# Patient Record
Sex: Male | Born: 1950 | Race: White | Hispanic: No | Marital: Married | Smoking: Never smoker
Health system: Southern US, Community
[De-identification: ages and names within clinical notes are randomized; demographics above are authoritative.]

## PROBLEM LIST (undated history)

## (undated) DIAGNOSIS — J42 Unspecified chronic bronchitis: Secondary | ICD-10-CM

## (undated) DIAGNOSIS — M2550 Pain in unspecified joint: Secondary | ICD-10-CM

## (undated) DIAGNOSIS — G43909 Migraine, unspecified, not intractable, without status migrainosus: Secondary | ICD-10-CM

## (undated) DIAGNOSIS — J189 Pneumonia, unspecified organism: Secondary | ICD-10-CM

## (undated) DIAGNOSIS — F419 Anxiety disorder, unspecified: Secondary | ICD-10-CM

## (undated) DIAGNOSIS — I10 Essential (primary) hypertension: Secondary | ICD-10-CM

## (undated) DIAGNOSIS — M1990 Unspecified osteoarthritis, unspecified site: Secondary | ICD-10-CM

## (undated) HISTORY — PX: BACK SURGERY: SHX140

## (undated) HISTORY — PX: TONSILLECTOMY: SUR1361

## (undated) HISTORY — PX: VASECTOMY: SHX75

## (undated) HISTORY — PX: JOINT REPLACEMENT: SHX530

## (undated) HISTORY — PX: COLONOSCOPY: SHX174

## (undated) HISTORY — PX: WISDOM TOOTH EXTRACTION: SHX21

## (undated) HISTORY — PX: SHOULDER OPEN ROTATOR CUFF REPAIR: SHX2407

---

## 1999-08-17 ENCOUNTER — Encounter: Payer: Self-pay | Admitting: Otolaryngology

## 1999-08-17 ENCOUNTER — Encounter: Admission: RE | Admit: 1999-08-17 | Discharge: 1999-08-17 | Payer: Self-pay | Admitting: Otolaryngology

## 1999-10-15 ENCOUNTER — Encounter: Admission: RE | Admit: 1999-10-15 | Discharge: 1999-11-05 | Payer: Self-pay | Admitting: Neurosurgery

## 2003-01-18 HISTORY — PX: LAMINECTOMY AND MICRODISCECTOMY LUMBAR SPINE: SHX1913

## 2003-07-16 ENCOUNTER — Ambulatory Visit (HOSPITAL_COMMUNITY): Admission: RE | Admit: 2003-07-16 | Discharge: 2003-07-17 | Payer: Self-pay | Admitting: Neurosurgery

## 2004-04-09 ENCOUNTER — Ambulatory Visit (HOSPITAL_COMMUNITY): Admission: RE | Admit: 2004-04-09 | Discharge: 2004-04-09 | Payer: Self-pay | Admitting: Gastroenterology

## 2006-01-17 HISTORY — PX: TOTAL HIP ARTHROPLASTY: SHX124

## 2006-03-31 ENCOUNTER — Inpatient Hospital Stay (HOSPITAL_COMMUNITY): Admission: RE | Admit: 2006-03-31 | Discharge: 2006-04-02 | Payer: Self-pay | Admitting: Orthopedic Surgery

## 2007-07-25 ENCOUNTER — Encounter: Admission: RE | Admit: 2007-07-25 | Discharge: 2007-07-25 | Payer: Self-pay | Admitting: Family Medicine

## 2009-01-17 HISTORY — PX: ANKLE ARTHROSCOPY: SHX545

## 2010-06-04 NOTE — Op Note (Signed)
NAME:  John Simpson, John Simpson NO.:  [id number]   MEDICAL RECORD NO.:  [id number]                   PATIENT TYPE:  OIB   LOCATION:  2899                                 FACILITY:  [facility]   PHYSICIAN:  Cristi Loron, M.D.            DATE OF BIRTH:  [birth year]   DATE OF PROCEDURE:  07/16/2003  DATE OF DISCHARGE:                                 OPERATIVE REPORT   PREOPERATIVE DIAGNOSES:  Left L5-S1 herniated nucleus pulposus, stenosis,  lumbar radiculopathy, lumbago.   POSTOPERATIVE DIAGNOSIS:  Left L5-S1 herniated nucleus pulposus, stenosis,  lumbar radiculopathy, lumbago.   PROCEDURE:  Left l5S1 microdiskectomy using microdissection.   SURGEON:  Cristi Loron, M.D.   ANESTHESIA:  General endotracheal.   ESTIMATED BLOOD LOSS:  50 mL.   SPECIMENS:  None.   DRAINS:  None.   COMPLICATIONS:  None.   BRIEF HISTORY:  The patient is a 60 year old white male who has suffered  from back and left leg pain consistent with a left S1 radiculopathy.  He  failed medical management and was worked up with a lumbar MRI, which  demonstrated a large herniated disk at L5-S1 on the left.  I discussed the  various treatment options with him, including surgery.  The patient has  weighed the risks, benefits, and alternatives of surgery and decided to  proceed with a left L5-S1 microdiskectomy.   DESCRIPTION OF PROCEDURE:  The patient was brought to the operating room by  the anesthesia team.  General endotracheal anesthesia was induced.  The  patient was turned to the prone position on a Wilson frame.  His lumbosacral  region was then shaved and prepared with Betadine scrub and Betadine  solution.  Sterile drapes were applied.  I then injected the area to be  incised with Marcaine with epinephrine solution and used a scalpel to make a  linear midline incision over the l5S1 interspace.  I used electrocautery to  perform a left-sided subperiosteal dissection exposing  the left spinous  process and lamina of L5-S1.  I inserted the McCullough retractor for  exposure and then obtained the intraoperative radiograph to confirm our  location.   We then brought the operating microscope into the field and under its  magnification and illumination completed the microdissection/decompression.  We used the high-speed drill to perform a left L5 laminotomy.  We widened  the laminotomy with the Kerrison punch, removing the left L5-S1 ligamentum  flavum.  We removed the cephalad aspect of the left S1 lamina and performed  a generous foraminotomy about the left S1 nerve root.  We then used  microdissection to free up the nerve root from the epidural tissue and then  the thecal sac was gently retracted medially with the D'Errico retractor,  which exposed a herniated disk which had migrated from the L5-S1 interspace  caudally and it was compressing the left S1 nerve root as it began to exit  the  neural foramen.  We used microdissection to free up the disk herniation  and removed it in multiple fragments using a pituitary forceps.  We then  inspected the intervertebral disk.  There was a hole in the annulus but no  impending herniations and no ventral compression of the thecal sac on the S1  nerve root, so we did not enter into the intervertebral disk space.  We then  obtained stringent hemostasis using bipolar electrocautery, copiously  irrigated the wound out with bacitracin solution, and then palpated along  the ventral surface of the thecal sac along the exit route of the S1 nerve  root, and noted the neural structures were well-decompressed.  We then  removed the McCullough retractor and then reapproximated the patient's  thoracolumbar fascia with interrupted #1 Vicryl suture, the subcutaneous  tissue with interrupted 2-0 Vicryl suture, and the skin with Steri-Strips  and Benzoin.  The wound was then coated with bacitracin ointment, a sterile  dressing was applied,  the drapes were removed, and the patient was  subsequently returned to the supine position, where he was extubated by the  anesthesia team and transported to the postanesthesia care unit in stable  condition.  All sponge, instrument, and needle counts were correct at the  end of this case.                                               Cristi Loron, M.D.    JDJ/MEDQ  D:  07/16/2003  T:  07/17/2003  Job:  [id number]

## 2010-06-04 NOTE — Op Note (Signed)
NAME:  John Simpson, John Simpson NO.:  [id number]   MEDICAL RECORD NO.:  [id number]          PATIENT TYPE:  INP   LOCATION:  5023                         FACILITY:  [facility]   PHYSICIAN:  Harvie Junior, M.D.   DATE OF BIRTH:  Nov 15, 1950   DATE OF PROCEDURE:  DATE OF DISCHARGE:                               OPERATIVE REPORT   PREOPERATIVE DIAGNOSIS:  End-stage degenerative joint disease right hip.   POSTOPERATIVE DIAGNOSIS:  End-stage degenerative joint disease right  hip.   PRINCIPAL PROCEDURE:  Right total hip replacement with a Laural Benes and  Johnson S-ROM prosthesis.  We used an 18/13 stem.  We used an 75 F large  cone.  We used a 36 plus 8 off set stem, a 52 ASR cup with a  corresponding 46 mm metallic ball, plus 0.   SURGEON:  Harvie Junior, M.D.   ASSISTANT:  Marshia Ly, P.A.   ANESTHESIA:  General.   BRIEF HISTORY:  Ms. Sales is a 60 year old male with a long history  of having significant right hip pain.  He had been treated conservative  for a long period time.  We have done intra-articular injections, we  have done anti-inflammatory medication.  All of this was attempted and  failed and because of complaints of pain and inability to enjoy life and  work, he was ultimately brought to the operating room for right total  hip replacement.   PROCEDURE:  The patient was brought to the operating room after adequate  anesthesia was obtained with general anesthetic, the patient was placed  supine on the operating room table.  The right hip was then prepped and  draped in the usual sterile fashion.  Following this, the patient was  moved into the left lateral decubitus position all bony prominence were  well padded.  Attention was then turned to the right hip which was  prepped and draped in the usual sterile fashion.  Following this, a  curved incision was made for a posterior approach to the hip,  subcutaneous tissues were taken down to the level of the  tensor fascia  which was divided in line with its fibers.  At this point, the  piriformis tendon was taken down and tagged, the short external rotator  was taken down and tagged.  The posterior capsule was taken down with a  flap posteriorly and inferiorly and then the hip was dislocated.  Provisional neck cut was made.  The acetabulum was then identified.  The  labrum was debrided.  The acetabulum was sequentially reamed to a level  of 51 and a 52ASR cup was hammered into place, excellent fit and  stability was achieved at this point and there was a little bit of  calcified labrum both anteriorly and posteriorly which was debrided at  this point.  Following this attention was turned to the femoral side  where the femur was sequentially reamed to a level of 13.  A 13.5 reamer  was taken down about 3/4 of the way and this had significant bone.  At  this point, attention was turned to the cone side  and this was  sequentially reamed to an 64 F large for a 36 cup which was previously  chosen.  After the hip was dislocated, the provisional neck cut was made  at the level of intended resection.   At this point, attention was turned towards the trial implants.  An 18-F  large trial cone was used after the mechanical milling-device was used  to make for a large spout.  The 18-F large cone was then placed.  The  trial 36 plus 8 off set with a +0 ball 46.  Range of motion showed  significant tightness with extension.  At that point, a fairly  significant anterior capsular release was performed.  Once that was  completed, the extension got much better.  We dialed in about 20 degrees  of anteversion and this seemed to help stability as well.  Once this was  completed, the trial implants were removed.  The final components were  put into place, 58 F large cone.  A 36 plus 8 off set stem 18/13 with 20  degrees of anteversion relative to the cone which gave about 40 degrees  of anteversion relative to  the neutral leg.   A 46 mm trial ball was then used and gave excellent off set stability  and at that point, the final ball was put in place, hammered in and then  the hip was reduced.  Excellent stability, excellent range of motion.  The tightness in external rotation improved dramatically with the  anterior capsular release and at this point, the short external rotators  and posterior capsule were repaired to the intertrochanteric line with  #2 Ethibond interrupted sutures.  The tensor fascia was closed with #1  Vicryl running suture, skin with #0 and 2-0 Vicryl and skin staples.  Sterile compressive dressing was applied as well as knee immobilizer.  The patient was taken to recovery and was noted to be in satisfactory  condition.   ESTIMATED BLOOD LOSS:  300 mL.      Harvie Junior, M.D.  Electronically Signed     JLG/MEDQ  D:  03/31/2006  T:  04/01/2006  Job:  [id number]

## 2010-06-04 NOTE — Op Note (Signed)
NAME:  John Simpson, John Simpson NO.:  [id number]   MEDICAL RECORD NO.:  [id number]          PATIENT TYPE:  AMB   LOCATION:  ENDO                         FACILITY:  [facility]   PHYSICIAN:  Petra Kuba, M.D.    DATE OF BIRTH:  [birth year]   DATE OF PROCEDURE:  04/09/2004  DATE OF DISCHARGE:                                 OPERATIVE REPORT   PROCEDURE PERFORMED:  Colonoscopy.   ENDOSCOPIST:  Petra Kuba, M.D.   INDICATIONS:  Screening.   CONSENT:  The consent was signed after risks, benefits, methods and options  were thoroughly with both myself and our colonoscopy nurse, Jan, in our  office.   MEDICATIONS:  Medicines used:  Demerol 75 and Versed 7.5.   DESCRIPTION OF PROCEDURE:  Rectal inspection was pertinent for external  hemorrhoids, small.  Digital exam was negative.   The video colonoscope was inserted and easily passed around the colon to the  cecum.  This did not require any abdominal pressure or any position changes.  No abnormality was seen on insertion.  The cecum was identified by the  appendiceal orifice and the ileocecal valve.  In fact, the scope was  inserted shortways in the terminal ileum, which was normal.  Further  documentation was obtained.  The scope was slowly withdrawn.   GI was normal.  On slow withdrawal back to the rectum no abnormalities were  seen, specifically no polyp, tumors, masses or diverticula. Once back in the  rectum anorectal pull-through and retroflexion confirmed some small  hemorrhoids.  The scope was straightened and readvanced shortways up the  left side of the colon.   Air was suctioned.  The scope was removed.   The patient tolerated the procedure well.   COMPLICATIONS:  There were no obvious immediate complications.   ENDOSCOPIC DIAGNOSES:  1.  Internal and external small hemorrhoids.  2.  Otherwise within normal limits to the terminal ileum.      MEM/MEDQ  D:  04/09/2004  T:  04/11/2004  Job:  [id number]   cc:    Jethro Bastos, M.D.  [address]  [place]  [place] [address]  Fax: [phone]

## 2012-01-30 ENCOUNTER — Encounter (HOSPITAL_BASED_OUTPATIENT_CLINIC_OR_DEPARTMENT_OTHER): Payer: Self-pay | Admitting: *Deleted

## 2012-01-30 ENCOUNTER — Encounter (HOSPITAL_BASED_OUTPATIENT_CLINIC_OR_DEPARTMENT_OTHER)
Admission: RE | Admit: 2012-01-30 | Discharge: 2012-01-30 | Disposition: A | Discharge status: Home / Self Care | Payer: Federal, State, Local not specified - PPO | Source: Ambulatory Visit | Attending: Orthopedic Surgery | Admitting: Orthopedic Surgery

## 2012-01-30 LAB — BASIC METABOLIC PANEL
Chloride: 100 mEq/L (ref 96–112)
Creatinine, Ser: 0.93 mg/dL (ref 0.50–1.35)
GFR calc Af Amer: >90 mL/min (ref >90)
Potassium: 3.7 mEq/L (ref 3.5–5.1)

## 2012-01-30 NOTE — Progress Notes (Signed)
To come in for [id number] To bring all meds and crutches and overnight bag

## 2012-02-01 ENCOUNTER — Encounter (HOSPITAL_BASED_OUTPATIENT_CLINIC_OR_DEPARTMENT_OTHER): Payer: Self-pay | Admitting: Anesthesiology

## 2012-02-01 ENCOUNTER — Ambulatory Visit (HOSPITAL_BASED_OUTPATIENT_CLINIC_OR_DEPARTMENT_OTHER): Payer: Federal, State, Local not specified - PPO | Admitting: Anesthesiology

## 2012-02-01 ENCOUNTER — Encounter (HOSPITAL_BASED_OUTPATIENT_CLINIC_OR_DEPARTMENT_OTHER): Payer: Self-pay | Admitting: *Deleted

## 2012-02-01 ENCOUNTER — Encounter (HOSPITAL_BASED_OUTPATIENT_CLINIC_OR_DEPARTMENT_OTHER)
Admission: RE | Disposition: A | Discharge status: Home / Self Care | Payer: Self-pay | Source: Ambulatory Visit | Attending: Orthopedic Surgery

## 2012-02-01 ENCOUNTER — Ambulatory Visit (HOSPITAL_BASED_OUTPATIENT_CLINIC_OR_DEPARTMENT_OTHER)
Admission: RE | Admit: 2012-02-01 | Discharge: 2012-02-02 | Disposition: A | Discharge status: Home / Self Care | Payer: Federal, State, Local not specified - PPO | Source: Ambulatory Visit | Attending: Orthopedic Surgery | Admitting: Orthopedic Surgery

## 2012-02-01 ENCOUNTER — Ambulatory Visit: Admit: 2012-02-01 | Payer: Self-pay | Admitting: Orthopedic Surgery

## 2012-02-01 DIAGNOSIS — M249 Joint derangement, unspecified: Secondary | ICD-10-CM | POA: Insufficient documentation

## 2012-02-01 DIAGNOSIS — I10 Essential (primary) hypertension: Secondary | ICD-10-CM | POA: Insufficient documentation

## 2012-02-01 DIAGNOSIS — M25579 Pain in unspecified ankle and joints of unspecified foot: Secondary | ICD-10-CM

## 2012-02-01 DIAGNOSIS — M19079 Primary osteoarthritis, unspecified ankle and foot: Secondary | ICD-10-CM | POA: Insufficient documentation

## 2012-02-01 DIAGNOSIS — M24176 Other articular cartilage disorders, unspecified foot: Secondary | ICD-10-CM | POA: Insufficient documentation

## 2012-02-01 DIAGNOSIS — M6240 Contracture of muscle, unspecified site: Secondary | ICD-10-CM | POA: Insufficient documentation

## 2012-02-01 HISTORY — DX: Unspecified osteoarthritis, unspecified site: M19.90

## 2012-02-01 HISTORY — PX: CALCANEAL OSTEOTOMY WITH TARSAL METATARSAL FUSION: SHX5607

## 2012-02-01 HISTORY — PX: ANKLE ARTHROSCOPY: SHX545

## 2012-02-01 HISTORY — PX: GASTROCNEMIUS RECESSION: SHX863

## 2012-02-01 HISTORY — DX: Essential (primary) hypertension: I10

## 2012-02-01 LAB — POCT HEMOGLOBIN-HEMACUE: Hemoglobin: 17.4 g/dL — ABNORMAL HIGH (ref 13.0–17.0)

## 2012-02-01 SURGERY — ARTHROSCOPY, ANKLE
Anesthesia: Regional | Site: Ankle | Laterality: Right | Wound class: Clean

## 2012-02-01 SURGERY — ARTHROSCOPY, ANKLE
Anesthesia: Choice | Site: Ankle | Laterality: Right

## 2012-02-01 MED ORDER — METHOCARBAMOL 500 MG PO TABS
500.0000 mg | ORAL_TABLET | Freq: Three times a day (TID) | ORAL | Status: DC | PRN
  Administered 2012-02-01: 500 mg via ORAL

## 2012-02-01 MED ORDER — OXYCODONE HCL 5 MG PO TABS: 5.0000 mg | ORAL_TABLET | Freq: Once | ORAL | Status: DC | PRN

## 2012-02-01 MED ORDER — LACTATED RINGERS IV SOLN
INTRAVENOUS | Status: DC
  Administered 2012-02-01 (×3): via INTRAVENOUS

## 2012-02-01 MED ORDER — METOCLOPRAMIDE HCL 5 MG PO TABS: 5.0000 mg | ORAL_TABLET | Freq: Three times a day (TID) | ORAL | Status: DC | PRN

## 2012-02-01 MED ORDER — MULTI-VITAMIN/MINERALS PO TABS: 1.0000 | ORAL_TABLET | Freq: Every day | ORAL | Status: DC

## 2012-02-01 MED ORDER — CEFAZOLIN SODIUM 1-5 GM-% IV SOLN
1.0000 g | Freq: Four times a day (QID) | INTRAVENOUS | Status: AC
  Administered 2012-02-01 – 2012-02-02 (×3): 1 g via INTRAVENOUS

## 2012-02-01 MED ORDER — METHOCARBAMOL 500 MG PO TABS
500.0000 mg | ORAL_TABLET | Freq: Four times a day (QID) | ORAL | Status: DC | PRN
  Administered 2012-02-02: 500 mg via ORAL

## 2012-02-01 MED ORDER — BUPIVACAINE-EPINEPHRINE PF 0.5-1:200000 % IJ SOLN
INTRAMUSCULAR | Status: DC | PRN
  Administered 2012-02-01: 30 mL

## 2012-02-01 MED ORDER — METHOCARBAMOL 100 MG/ML IJ SOLN: 500.0000 mg | Freq: Four times a day (QID) | INTRAVENOUS | Status: DC | PRN

## 2012-02-01 MED ORDER — CHLORHEXIDINE GLUCONATE 4 % EX LIQD: 60.0000 mL | Freq: Once | CUTANEOUS | Status: DC

## 2012-02-01 MED ORDER — FENTANYL CITRATE 0.05 MG/ML IJ SOLN
INTRAMUSCULAR | Status: DC | PRN
  Administered 2012-02-01: 50 ug via INTRAVENOUS

## 2012-02-01 MED ORDER — BUPIVACAINE HCL (PF) 0.5 % IJ SOLN
INTRAMUSCULAR | Status: DC | PRN
  Administered 2012-02-01: 15 mL

## 2012-02-01 MED ORDER — ACETAMINOPHEN 10 MG/ML IV SOLN: 1000.0000 mg | Freq: Once | INTRAVENOUS | Status: DC

## 2012-02-01 MED ORDER — CEFAZOLIN SODIUM-DEXTROSE 2-3 GM-% IV SOLR
2.0000 g | INTRAVENOUS | Status: AC
  Administered 2012-02-01: 2 g via INTRAVENOUS

## 2012-02-01 MED ORDER — MORPHINE SULFATE 2 MG/ML IJ SOLN: 2.0000 mg | INTRAMUSCULAR | Status: DC | PRN

## 2012-02-01 MED ORDER — ONDANSETRON HCL 4 MG/2ML IJ SOLN: 4.0000 mg | Freq: Four times a day (QID) | INTRAMUSCULAR | Status: DC | PRN

## 2012-02-01 MED ORDER — OXYCODONE HCL 5 MG/5ML PO SOLN: 5.0000 mg | Freq: Once | ORAL | Status: DC | PRN

## 2012-02-01 MED ORDER — HYDROMORPHONE HCL PF 1 MG/ML IJ SOLN: 0.2500 mg | INTRAMUSCULAR | Status: DC | PRN

## 2012-02-01 MED ORDER — ASPIRIN EC 325 MG PO TBEC: 325.0000 mg | DELAYED_RELEASE_TABLET | Freq: Two times a day (BID) | ORAL | Status: DC

## 2012-02-01 MED ORDER — SODIUM CHLORIDE 0.9 % IV SOLN
INTRAVENOUS | Status: DC
  Administered 2012-02-01: 50 mL/h via INTRAVENOUS

## 2012-02-01 MED ORDER — DIPHENHYDRAMINE HCL 12.5 MG/5ML PO ELIX: 12.5000 mg | ORAL_SOLUTION | ORAL | Status: DC | PRN

## 2012-02-01 MED ORDER — FENTANYL CITRATE 0.05 MG/ML IJ SOLN
50.0000 ug | INTRAMUSCULAR | Status: DC | PRN
  Administered 2012-02-01: 100 ug via INTRAVENOUS

## 2012-02-01 MED ORDER — LIDOCAINE HCL (CARDIAC) 20 MG/ML IV SOLN
INTRAVENOUS | Status: DC | PRN
  Administered 2012-02-01: 60 mg via INTRAVENOUS

## 2012-02-01 MED ORDER — EPHEDRINE SULFATE 50 MG/ML IJ SOLN
INTRAMUSCULAR | Status: DC | PRN
  Administered 2012-02-01 (×2): 10 mg via INTRAVENOUS

## 2012-02-01 MED ORDER — SODIUM CHLORIDE 0.9 % IR SOLN
Status: DC | PRN
  Administered 2012-02-01: 5500 mL

## 2012-02-01 MED ORDER — SODIUM CHLORIDE 0.9 % IV SOLN: INTRAVENOUS | Status: DC

## 2012-02-01 MED ORDER — GLYCOPYRROLATE 0.2 MG/ML IJ SOLN
INTRAMUSCULAR | Status: DC | PRN
  Administered 2012-02-01: 0.2 mg via INTRAVENOUS

## 2012-02-01 MED ORDER — TEMAZEPAM 15 MG PO CAPS
15.0000 mg | ORAL_CAPSULE | Freq: Every evening | ORAL | Status: DC | PRN
  Administered 2012-02-01: 30 mg via ORAL

## 2012-02-01 MED ORDER — ONDANSETRON HCL 4 MG/2ML IJ SOLN
INTRAMUSCULAR | Status: DC | PRN
  Administered 2012-02-01: 4 mg via INTRAVENOUS

## 2012-02-01 MED ORDER — LISINOPRIL-HYDROCHLOROTHIAZIDE 20-12.5 MG PO TABS: 1.0000 | ORAL_TABLET | Freq: Every day | ORAL | Status: DC

## 2012-02-01 MED ORDER — DEXAMETHASONE SODIUM PHOSPHATE 10 MG/ML IJ SOLN
INTRAMUSCULAR | Status: DC | PRN
  Administered 2012-02-01: 10 mg via INTRAVENOUS

## 2012-02-01 MED ORDER — OXYCODONE-ACETAMINOPHEN 5-325 MG PO TABS
1.0000 | ORAL_TABLET | ORAL | Status: DC | PRN
  Administered 2012-02-01 – 2012-02-02 (×3): 2 via ORAL

## 2012-02-01 MED ORDER — OXYCODONE-ACETAMINOPHEN 5-325 MG PO TABS: 1.0000 | ORAL_TABLET | ORAL | Status: DC | PRN

## 2012-02-01 MED ORDER — METOCLOPRAMIDE HCL 5 MG/ML IJ SOLN: 5.0000 mg | Freq: Three times a day (TID) | INTRAMUSCULAR | Status: DC | PRN

## 2012-02-01 MED ORDER — METHOCARBAMOL 500 MG PO TABS: 500.0000 mg | ORAL_TABLET | Freq: Three times a day (TID) | ORAL | Status: DC

## 2012-02-01 MED ORDER — ONDANSETRON HCL 4 MG PO TABS: 4.0000 mg | ORAL_TABLET | Freq: Four times a day (QID) | ORAL | Status: DC | PRN

## 2012-02-01 MED ORDER — VITAMIN C 500 MG PO TABS: 500.0000 mg | ORAL_TABLET | Freq: Every day | ORAL | Status: AC

## 2012-02-01 MED ORDER — MIDAZOLAM HCL 2 MG/2ML IJ SOLN
0.5000 mg | INTRAMUSCULAR | Status: DC | PRN
  Administered 2012-02-01: 2 mg via INTRAVENOUS

## 2012-02-01 MED ORDER — PROPOFOL 10 MG/ML IV BOLUS
INTRAVENOUS | Status: DC | PRN
  Administered 2012-02-01: 200 mg via INTRAVENOUS

## 2012-02-01 SURGICAL SUPPLY — 113 items
BANDAGE ELASTIC 4 VELCRO ST LF (GAUZE/BANDAGES/DRESSINGS) ×4 IMPLANT
BANDAGE ELASTIC 6 VELCRO ST LF (GAUZE/BANDAGES/DRESSINGS) ×4 IMPLANT
BANDAGE GAUZE ELAST BULKY 4 IN (GAUZE/BANDAGES/DRESSINGS) ×4 IMPLANT
BENZOIN TINCTURE PRP APPL 2/3 (GAUZE/BANDAGES/DRESSINGS) ×2 IMPLANT
BLADE AVERAGE 25X9 (BLADE) ×2 IMPLANT
BLADE CCA MICRO SAG (BLADE) ×2 IMPLANT
BLADE CUDA 2.0 (BLADE) IMPLANT
BLADE CUDA SHAVER 3.5 (BLADE) ×2 IMPLANT
BLADE MICRO SAGITTAL (BLADE) ×2 IMPLANT
BLADE OSC/SAG .038X5.5 CUT EDG (BLADE) IMPLANT
BLADE SURG 11 STRL SS (BLADE) IMPLANT
BLADE SURG 15 STRL LF DISP TIS (BLADE) ×4 IMPLANT
BLADE SURG 15 STRL SS (BLADE) ×4
BRUSH SCRUB EZ PLAIN DRY (MISCELLANEOUS) ×2 IMPLANT
BUR 3.5 LG SPHERICAL (BURR) IMPLANT
BUR CUDA 2.9 (BURR) IMPLANT
BUR EGG 3PK/BX (BURR) ×2 IMPLANT
BUR GATOR 2.9 (BURR) IMPLANT
BUR OVAL 4.0 (BURR) IMPLANT
BUR SPHERICAL 2.9 (BURR) IMPLANT
BURR 3.5 LG SPHERICAL (BURR)
CANISTER OMNI JUG 16 LITER (MISCELLANEOUS) ×2 IMPLANT
CANISTER SUCTION 2500CC (MISCELLANEOUS) IMPLANT
CAP PIN PROTECTOR ORTHO WHT (CAP) IMPLANT
CLOTH BEACON ORANGE TIMEOUT ST (SAFETY) ×2 IMPLANT
COTTON STERILE ROLL (GAUZE/BANDAGES/DRESSINGS) ×2 IMPLANT
COVER TABLE BACK 60X90 (DRAPES) ×2 IMPLANT
CUFF TOURNIQUET SINGLE 34IN LL (TOURNIQUET CUFF) ×2 IMPLANT
DRAPE ARTHROSCOPY W/POUCH 114 (DRAPES) ×2 IMPLANT
DRAPE EXTREMITY T 121X128X90 (DRAPE) ×2 IMPLANT
DRAPE INCISE IOBAN 66X45 STRL (DRAPES) IMPLANT
DRAPE OEC MINIVIEW 54X84 (DRAPES) ×2 IMPLANT
DRAPE PED LAPAROTOMY (DRAPES) IMPLANT
DRAPE SURG 17X23 STRL (DRAPES) ×2 IMPLANT
DRSG PAD ABDOMINAL 8X10 ST (GAUZE/BANDAGES/DRESSINGS) ×4 IMPLANT
DURA STEPPER LG (CAST SUPPLIES) IMPLANT
DURA STEPPER MED (CAST SUPPLIES) IMPLANT
DURA STEPPER SML (CAST SUPPLIES) IMPLANT
DURAPREP 26ML APPLICATOR (WOUND CARE) ×2 IMPLANT
ELECT REM PT RETURN 9FT ADLT (ELECTROSURGICAL) ×2
ELECTRODE REM PT RTRN 9FT ADLT (ELECTROSURGICAL) ×1 IMPLANT
FIBERLOOP 2 0 (SUTURE) IMPLANT
GAUZE SPONGE 4X4 16PLY XRAY LF (GAUZE/BANDAGES/DRESSINGS) IMPLANT
GAUZE XEROFORM 1X8 LF (GAUZE/BANDAGES/DRESSINGS) ×2 IMPLANT
GAUZE XEROFORM 5X9 LF (GAUZE/BANDAGES/DRESSINGS) ×2 IMPLANT
GLOVE BIO SURGEON STRL SZ8 (GLOVE) ×2 IMPLANT
GLOVE BIOGEL M STRL SZ7.5 (GLOVE) ×2 IMPLANT
GLOVE BIOGEL PI IND STRL 8 (GLOVE) ×3 IMPLANT
GLOVE BIOGEL PI INDICATOR 8 (GLOVE) ×3
GLOVE ECLIPSE 6.5 STRL STRAW (GLOVE) ×2 IMPLANT
GLOVE SURG SS PI 8.0 STRL IVOR (GLOVE) ×2 IMPLANT
GOWN BRE IMP PREV XXLGXLNG (GOWN DISPOSABLE) ×6 IMPLANT
GOWN PREVENTION PLUS XLARGE (GOWN DISPOSABLE) ×2 IMPLANT
IMPLANT OP-1 (Orthopedic Implant) ×2 IMPLANT
KWIRE 4.0 X .045IN (WIRE) IMPLANT
NDL SUT 6 .5 CRC .975X.05 MAYO (NEEDLE) IMPLANT
NEEDLE HYPO 22GX1.5 SAFETY (NEEDLE) IMPLANT
NEEDLE MAYO TAPER (NEEDLE)
NS IRRIG 1000ML POUR BTL (IV SOLUTION) ×2 IMPLANT
PACK ARTHROSCOPY DSU (CUSTOM PROCEDURE TRAY) ×2 IMPLANT
PACK BASIN DAY SURGERY FS (CUSTOM PROCEDURE TRAY) ×2 IMPLANT
PAD CAST 4YDX4 CTTN HI CHSV (CAST SUPPLIES) ×2 IMPLANT
PADDING CAST ABS 4INX4YD NS (CAST SUPPLIES) ×2
PADDING CAST ABS 6INX4YD NS (CAST SUPPLIES) ×1
PADDING CAST ABS COTTON 4X4 ST (CAST SUPPLIES) ×2 IMPLANT
PADDING CAST ABS COTTON 6X4 NS (CAST SUPPLIES) ×1 IMPLANT
PADDING CAST COTTON 4X4 STRL (CAST SUPPLIES) ×2
PASSER SUT SWANSON 36MM LOOP (INSTRUMENTS) IMPLANT
PENCIL BUTTON HOLSTER BLD 10FT (ELECTRODE) ×2 IMPLANT
SCREW CANN 6.7X55 18 THD SD (Screw) ×4 IMPLANT
SCREW LP TIT 3.5X30 (Screw) ×2 IMPLANT
SHEET MEDIUM DRAPE 40X70 STRL (DRAPES) ×4 IMPLANT
SLEEVE SCD COMPRESS KNEE MED (MISCELLANEOUS) ×2 IMPLANT
SPLINT FAST PLASTER 5X30 (CAST SUPPLIES) ×20
SPLINT PLASTER CAST FAST 5X30 (CAST SUPPLIES) ×20 IMPLANT
SPONGE GAUZE 4X4 12PLY (GAUZE/BANDAGES/DRESSINGS) ×4 IMPLANT
SPONGE LAP 4X18 X RAY DECT (DISPOSABLE) ×2 IMPLANT
STOCKINETTE 6  STRL (DRAPES) ×1
STOCKINETTE 6 STRL (DRAPES) ×1 IMPLANT
STRAP ANKLE FOOT DISTRACTOR (ORTHOPEDIC SUPPLIES) IMPLANT
STRIP CLOSURE SKIN 1/2X4 (GAUZE/BANDAGES/DRESSINGS) ×2 IMPLANT
SUCTION FRAZIER TIP 10 FR DISP (SUCTIONS) ×2 IMPLANT
SUT BONE WAX W31G (SUTURE) ×2 IMPLANT
SUT ETHIBOND 0 MO6 C/R (SUTURE) IMPLANT
SUT ETHIBOND 2 OS 4 DA (SUTURE) IMPLANT
SUT ETHILON 3 0 PS 1 (SUTURE) IMPLANT
SUT ETHILON 4 0 PS 2 18 (SUTURE) ×10 IMPLANT
SUT FIBERWIRE #2 38 T-5 BLUE (SUTURE)
SUT FIBERWIRE 2-0 18 17.9 3/8 (SUTURE) ×2
SUT MNCRL AB 4-0 PS2 18 (SUTURE) ×2 IMPLANT
SUT MON AB 4-0 PC3 18 (SUTURE) ×2 IMPLANT
SUT PDS AB 3-0 PS2 18 (SUTURE) ×2 IMPLANT
SUT VIC AB 0 SH 27 (SUTURE) IMPLANT
SUT VIC AB 2-0 PS2 27 (SUTURE) ×2 IMPLANT
SUT VIC AB 2-0 SH 18 (SUTURE) IMPLANT
SUT VIC AB 2-0 SH 27 (SUTURE)
SUT VIC AB 2-0 SH 27XBRD (SUTURE) IMPLANT
SUT VIC AB 3-0 PS1 18 (SUTURE) ×6
SUT VIC AB 3-0 PS1 18XBRD (SUTURE) ×3 IMPLANT
SUTURE FIBERWR #2 38 T-5 BLUE (SUTURE) IMPLANT
SUTURE FIBERWR 2-0 18 17.9 3/8 (SUTURE) ×1 IMPLANT
SYR 20CC LL (SYRINGE) IMPLANT
SYR 3ML 18GX1 1/2 (SYRINGE) ×2 IMPLANT
SYR BULB 3OZ (MISCELLANEOUS) ×2 IMPLANT
SYR CONTROL 10ML LL (SYRINGE) IMPLANT
TOWEL OR 17X24 6PK STRL BLUE (TOWEL DISPOSABLE) ×6 IMPLANT
TOWEL OR NON WOVEN STRL DISP B (DISPOSABLE) ×2 IMPLANT
TRAY DSU PREP LF (CUSTOM PROCEDURE TRAY) ×2 IMPLANT
TUBE CONNECTING 20X1/4 (TUBING) ×4 IMPLANT
TUBING ARTHROSCOPY IRRIG 16FT (MISCELLANEOUS) ×2 IMPLANT
UNDERPAD 30X30 INCONTINENT (UNDERPADS AND DIAPERS) ×2 IMPLANT
WAND SHORT BEVEL W/CORD (SURGICAL WAND) ×2 IMPLANT
WATER STERILE IRR 1000ML POUR (IV SOLUTION) ×2 IMPLANT

## 2012-02-01 NOTE — H&P (Signed)
  H&P documentation: Placed to be scanned history and physical exam in chart.  -History and Physical Reviewed  -Patient has been re-examined  -No change in the plan of care  Azrael Maddix A  

## 2012-02-01 NOTE — Brief Op Note (Signed)
02/01/2012  1:54 PM  PATIENT:  John Simpson  62 y.o. male  PRE-OPERATIVE DIAGNOSIS:  RIGHT ANTERIOR LATERAL ANKLE IMPINGEMENT WITH VALGUS TALAR TILT, 2ND TMT ARTHRITISTIGHT GASTROC, HINDFOOT MALALIGNMENT  POST-OPERATIVE DIAGNOSIS:  RIGHT ANTERIOR LATERAL ANKLE IMPINGEMENT WITH  PROCEDURE:  Procedure(s) (LRB) with comments: ANKLE ARTHROSCOPY (Right) - WITH EXTENSIVE DEBRIDEMENT GASTROCNEMIUS SLIDE (Right) CALCANEAL OSTEOTOMY WITH TARSAL METATARSAL FUSION (Right) - MEDIALIZING CALCANEAL OSTEOTOMY, 2ND TMT JOINT FUSION WITHOUT OSTEOTOMY, LOCAL BONE GRAFT, STRESS X-RAYS FOOT, POSSIBLE INTERCUNEIFORM FUSION   SURGEON:  Surgeon(s) and Role:    * Sherri Rad, MD - Primary  PHYSICIAN ASSISTANT: Rexene Edison, PAC   ASSISTANTS: Rexene Edison, [facility]    ANESTHESIA:   general  EBL:  Total I/O In: 2000 [I.V.:2000] Out: -   BLOOD ADMINISTERED:none  DRAINS: none   LOCAL MEDICATIONS USED:  NONE  SPECIMEN:  No Specimen  DISPOSITION OF SPECIMEN:  N/A  COUNTS:  YES  TOURNIQUET:  * Missing tourniquet times found for documented tourniquets in log:  16109 *  DICTATION: .Other Dictation: Dictation Number [id number]  PLAN OF CARE: Admit for overnight observation  PATIENT DISPOSITION:  PACU - hemodynamically stable.   Delay start of Pharmacological VTE agent (>24hrs) due to surgical blood loss or risk of bleeding: no

## 2012-02-01 NOTE — Transfer of Care (Signed)
Immediate Anesthesia Transfer of Care Note  Patient: John Simpson  Procedure(s) Performed: Procedure(s) (LRB) with comments: ANKLE ARTHROSCOPY (Right) - WITH EXTENSIVE DEBRIDEMENT GASTROCNEMIUS SLIDE (Right) CALCANEAL OSTEOTOMY WITH TARSAL METATARSAL FUSION (Right) - MEDIALIZING CALCANEAL OSTEOTOMY, 2ND TMT JOINT FUSION WITHOUT OSTEOTOMY, LOCAL BONE GRAFT, STRESS X-RAYS FOOT, POSSIBLE INTERCUNEIFORM FUSION   Patient Location: PACU  Anesthesia Type:GA combined with regional for post-op pain  Level of Consciousness: awake, alert  and oriented  Airway & Oxygen Therapy: Patient Spontanous Breathing and Patient connected to face mask oxygen  Post-op Assessment: Report given to PACU RN, Post -op Vital signs reviewed and stable and Patient moving all extremities  Post vital signs: Reviewed and stable  Complications: No apparent anesthesia complications

## 2012-02-01 NOTE — Anesthesia Preprocedure Evaluation (Addendum)
Anesthesia Evaluation  Patient identified by MRN, date of birth, ID band Patient awake    Reviewed: Allergy & Precautions, H&P , NPO status , Patient's Chart, lab work & pertinent test results  Airway Mallampati: II TM Distance: >3 FB Neck ROM: Full    Dental No notable dental hx. (+) Teeth Intact and Dental Advisory Given   Pulmonary neg pulmonary ROS,  breath sounds clear to auscultation  Pulmonary exam normal       Cardiovascular hypertension, On Medications Rhythm:Regular Rate:Normal     Neuro/Psych negative neurological ROS  negative psych ROS   GI/Hepatic negative GI ROS, Neg liver ROS,   Endo/Other  negative endocrine ROS  Renal/GU negative Renal ROS  negative genitourinary   Musculoskeletal   Abdominal   Peds  Hematology negative hematology ROS (+)   Anesthesia Other Findings   Reproductive/Obstetrics negative OB ROS                           Anesthesia Physical Anesthesia Plan  ASA: II  Anesthesia Plan: General and Regional   Post-op Pain Management:    Induction: Intravenous  Airway Management Planned: LMA  Additional Equipment:   Intra-op Plan:   Post-operative Plan: Extubation in OR  Informed Consent: I have reviewed the patients History and Physical, chart, labs and discussed the procedure including the risks, benefits and alternatives for the proposed anesthesia with the patient or authorized representative who has indicated his/her understanding and acceptance.   Dental advisory given  Plan Discussed with: CRNA  Anesthesia Plan Comments:         Anesthesia Quick Evaluation

## 2012-02-01 NOTE — Progress Notes (Signed)
Assisted Dr. Fitzgerald with right, ultrasound guided, popliteal/saphenous block. Side rails up, monitors on throughout procedure. See vital signs in flow sheet. Tolerated Procedure well. 

## 2012-02-01 NOTE — Anesthesia Procedure Notes (Addendum)
Anesthesia Regional Block:  Popliteal block  Pre-Anesthetic Checklist: ,, timeout performed, Correct Patient, Correct Site, Correct Laterality, Correct Procedure, Correct Position, site marked, Risks and benefits discussed, pre-op evaluation, post-op pain management  Laterality: Right  Prep: Maximum Sterile Barrier Precautions used and chloraprep       Needles:  Injection technique: Single-shot  Needle Type: Echogenic Stimulator Needle          Additional Needles:  Procedures: ultrasound guided (picture in chart) and nerve stimulator Popliteal block  Nerve Stimulator or Paresthesia:  Response: Peroneal, 0.4 mA,  Response: Tibial, 0.4 mA,   Additional Responses:   Narrative:  Start time: 02/01/2012 9:29 AM End time: 02/01/2012 9:40 AM Injection made incrementally with aspirations every 5 mL. Anesthesiologist: Sampson Goon, MD  Additional Notes: 2% Lidocaine skin wheel. Saphenous block above the knee with 10cc of 0.5% Bupivicaine plain.  Popliteal block Procedure Name: LMA Insertion Date/Time: 02/01/2012 10:50 AM Performed by: Suann Larry WOLFE Pre-anesthesia Checklist: Patient identified, Emergency Drugs available, Suction available and Patient being monitored Patient Re-evaluated:Patient Re-evaluated prior to inductionOxygen Delivery Method: Circle system utilized Preoxygenation: Pre-oxygenation with 100% oxygen Intubation Type: IV induction Ventilation: Mask ventilation without difficulty LMA: LMA inserted LMA Size: 5.0 Number of attempts: 1 Airway Equipment and Method: Bite block Placement Confirmation: breath sounds checked- equal and bilateral and positive ETCO2 Tube secured with: Tape

## 2012-02-01 NOTE — Anesthesia Postprocedure Evaluation (Signed)
  Anesthesia Post-op Note  Patient: John Simpson  Procedure(s) Performed: Procedure(s) (LRB) with comments: ANKLE ARTHROSCOPY (Right) - WITH EXTENSIVE DEBRIDEMENT GASTROCNEMIUS SLIDE (Right) CALCANEAL OSTEOTOMY WITH TARSAL METATARSAL FUSION (Right) - MEDIALIZING CALCANEAL OSTEOTOMY, 2ND TMT JOINT FUSION WITHOUT OSTEOTOMY, LOCAL BONE GRAFT, STRESS X-RAYS FOOT, POSSIBLE INTERCUNEIFORM FUSION   Patient Location: PACU  Anesthesia Type:GA combined with regional for post-op pain  Level of Consciousness: awake, alert  and oriented  Airway and Oxygen Therapy: Patient Spontanous Breathing  Post-op Pain: none  Post-op Assessment: Post-op Vital signs reviewed, Patient's Cardiovascular Status Stable, Respiratory Function Stable, Patent Airway and No signs of Nausea or vomiting  Post-op Vital Signs: Reviewed and stable  Complications: No apparent anesthesia complications

## 2012-02-02 NOTE — Op Note (Signed)
NAME:  John Simpson, John Simpson NO.:  [id number]  MEDICAL RECORD NO.:  [id number]  LOCATION:                                 FACILITY:  PHYSICIAN:  Leonides Grills, M.D.     DATE OF BIRTH:  29-May-1950  DATE OF PROCEDURE:  02/01/2012 DATE OF DISCHARGE:                              OPERATIVE REPORT   PREOPERATIVE DIAGNOSES: 1. Right anterior ankle impingement with valgus talar tilt and lateral     compartment arthritis. 2. Right hindfoot malalignment. 3. Right tight gastroc. 4. Right second tarsometatarsal joint arthritis.  POSTOPERATIVE DIAGNOSES: 1. Right anterior ankle impingement with valgus talar tilt and lateral     compartment arthritis. 2. Right hindfoot malalignment. 3. Right tight gastroc. 4. Right second tarsometatarsal joint arthritis.  OPERATIONS: 1. Right ankle arthroscopy. 2. Extensive debridement to his right gastroc slide. 3. Right medializing calcaneal osteotomy. 4. Right second tarsometatarsal joint fusion without osteotomy. 5. Right local bone graft. 6. Stress x-rays, right foot.  ANESTHESIA:  General.  SURGEON:  Leonides Grills, MD  ASSISTANT:  Richardean Canal, PA  ESTIMATED BLOOD LOSS:  Minimal.  COMPLICATIONS:  None.  IMPLANTS: 1. Arthrex 3.5-mm screw x1 Titanium. 2. Arthrex 6.7-mm cannulated screw Titanium.  DISPOSITION:  Stable to PR.  INDICATION:  This is a 62 year old gentleman who has had long-standing right ankle pain and foot pain due to the above pathology that was interfering with his life to the point where he could not to what he wanted to do despite conservative management.  He was consented for the above procedure.  All risks of infection, vessel injury, nonunion, malunion, hardware irritation, hardware failure, persistent pain worse pain, prolonged recovery, stiffness, arthritis, wound healing problems, DVT, PE, and, and the fact that his ankle arthritis could progress were all explained.  Questions were encouraged and  answered.  DESCRIPTION OF PROCEDURE:  The patient was brought to the operating room and placed in supine position after adequate general anesthesia administered as well as Ancef 1 g IV piggyback.  A bump was placed on the right ipsilateral hip, internally rotating right lower extremity. All bony prominences were well padded.  The right lower extremity was then prepped and draped in sterile manner over proximally thigh tourniquet.  We started the procedure by mapping out the anatomical landmarks including the tibialis tendon, peroneus tertius, and superficial peroneal nerve.  We could not be seen clearly, but peroneus tertius could be palpated.  Spinal needle was then placed just medial to the anterior tibialis tendon.  A 20 mL of normal saline was instilled in the ankle.  Weston Brass and spread technique was then utilized, creating anteromedial portal medial to the anterior tibialis tendon.  Blunt tip trocar with cannula followed by camera was then placed in the ankle under direct visualization.  The anterolateral portal was created with spinal needle followed by nick and spread technique.  There was tremendous amount of synovitis over the entire anterior aspect of the ankle.  There was obvious lateral compartment arthritic changes and a very large anterior distal tibial spur that extended into the lateral malleolus as well.  Then, with a curved corners osteotome, burr, and shaver, we meticulously removed the bone that formed  a spur over the anterior aspect of his ankle over the entire anterior aspect of the ankle and cleaned off the synovitis as well.  Hemostasis was obtained via radiofrequency bevel.  We then ranged the ankle.  There was no impingement whatsoever over the entire anterior aspect of the ankle. This was done from lateral to medial.  We also debrided the lateral and medial gutters as well of synovitis and spurs as well.  The medial compartment had decent cartilage as well.   Pictures were obtained throughout the procedure.  Camera was removed.  Wounds were closed with 4-0 nylon stitch.  The limb was then gravity exsanguinated.  At this point, we made a longitudinal incision in the medial aspect of the gastrocnemius musculotendinous junction.  Dissection was carried down through skin.  Hemostasis was obtained.  Fascia was opened in line with the incision.  Conjoined region was then developed to the gastroc soleus.  Soft tissue was elevated off the posterior aspect of gastrocnemius.  Sural nerve was identified and protected posteriorly throughout the case.  Gastrocnemius had released.  Curved Mayo scissors protecting the sural nerve at all times.  This had extra release of tight gastroc.  The area was copiously irrigated with normal saline. Subcu was closed with 3-0 Vicryl, skin was closed with 4-0 Monocryl subcuticular stitch.  Steri-Strips were applied.  Limb was then gravity exsanguinated.  Tourniquet elevated to 290 mmHg.  A longitudinal incision over the lateral aspect of the calcaneal tuber perpendicular to the calcaneal pitch was made.  Dissection was carried down through skin. Hemostasis was obtained.  Careful dissection was carried down to the lateral calcaneus.  Soft tissue was elevated and retracted out of harm's way throughout the case.  Implants were then placed.  Sagittal saw was then utilized to create an osteotomy through the calcaneal tuber.  Once this was done, we then translated the heel approximately a cm medially and then provisionally fixed this with a K-wire.  We then placed through separate incision in the posterior aspect of the heel, 6.7-mm partially threaded cannulated Arthrex Titanium screws x2, 55 mm in length.  This had excellent purchase and maintenance of desired position.  K-wires were removed.  Stress x-ray was obtained in the lateral axial views of the hindfoot and showed gross motion, fixation, proposition, and excellent  alignment as well.  Screws in its proper position as well as the fact that heel was adequately translated medially.  Wound was copiously irrigated with normal saline.  We then made a longitudinal incision over the dorsal aspect of the right second TMT joint just lateral to the neurovascular bundle.  Dissection was carried down through skin.  Hemostasis was obtained.  Dorsalis pedis artery and deep peroneal nerves were identified and retracted out of harm's way throughout the case.  Second TMT joint was then entered.  There was a large spur dorsally and this was removed with a rongeur, and placed on the back table with local bone graft.  We then entered the second TMT joint.  This was verified by C-arm guidance to be in the proper position.  There was no cartilage left in the joint.  Whatever was left scar in the joint were removed with curved corners osteotome, curette, and rongeur.  We then placed 2 multiple 2-mm drill holes on either side of the joint with the aid of a lamina spreader.  We then applied local bone graft as well as OP-1 graft into this area.  We then used a  two- point reduction clamp to compress the joint down.  We then used a burr to create a notch at the base of the second metatarsal and placed a 3.5- mm fully-threaded cortical lag screw using a 3.5 and 2.5 mm drill hole respectively.  This had excellent purchase and maintenance of the desired position and hand as well as excellent compression across the second TMT joint.  We then placed stress strain, relieving local graft in this area as well.  We then obtained stress x-rays in AP, lateral, and oblique planes that showed gross motion, fixation, proposition, and excellent alignment as well.  Prior to graft placement, the area was copiously irrigated with normal saline.  Tourniquet was deflated. Hemostasis was obtained.  There was no pulsatile bleeding.  Subcu over second TMT joint was closed with 4-0 PDS.  Skin was  closed with 4-0 nylon over all wounds.  Sterile dressing was applied.  Modified Jones dressing was applied with the ankle in neutral dorsiflexion.  The patient was stable to PR.     Leonides Grills, M.D.     PB/MEDQ  D:  02/01/2012  T:  02/02/2012  Job:  [id number]

## 2012-02-03 ENCOUNTER — Encounter (HOSPITAL_BASED_OUTPATIENT_CLINIC_OR_DEPARTMENT_OTHER): Payer: Self-pay | Admitting: Orthopedic Surgery

## 2015-02-20 ENCOUNTER — Other Ambulatory Visit: Payer: Self-pay | Admitting: Chiropractic Medicine

## 2015-02-20 DIAGNOSIS — M25512 Pain in left shoulder: Secondary | ICD-10-CM

## 2015-02-26 ENCOUNTER — Ambulatory Visit
Admission: RE | Admit: 2015-02-26 | Discharge: 2015-02-26 | Disposition: A | Discharge status: Home / Self Care | Payer: Federal, State, Local not specified - PPO | Source: Ambulatory Visit | Attending: Chiropractic Medicine | Admitting: Chiropractic Medicine

## 2015-02-26 DIAGNOSIS — M25512 Pain in left shoulder: Secondary | ICD-10-CM

## 2015-05-21 ENCOUNTER — Other Ambulatory Visit: Payer: Self-pay | Admitting: Orthopedic Surgery

## 2015-05-22 ENCOUNTER — Encounter (HOSPITAL_COMMUNITY): Payer: Self-pay | Admitting: *Deleted

## 2015-05-25 ENCOUNTER — Encounter (HOSPITAL_COMMUNITY): Payer: Self-pay | Admitting: Anesthesiology

## 2015-05-25 ENCOUNTER — Inpatient Hospital Stay (HOSPITAL_COMMUNITY): Payer: Federal, State, Local not specified - PPO | Admitting: Anesthesiology

## 2015-05-25 ENCOUNTER — Encounter (HOSPITAL_COMMUNITY)
Admission: RE | Disposition: A | Discharge status: Home Health | Payer: Self-pay | Source: Ambulatory Visit | Attending: Orthopedic Surgery

## 2015-05-25 ENCOUNTER — Inpatient Hospital Stay (HOSPITAL_COMMUNITY)
Admission: RE | Admit: 2015-05-25 | Discharge: 2015-05-28 | DRG: 478 | Disposition: A | Discharge status: Home Health | Payer: Federal, State, Local not specified - PPO | Source: Ambulatory Visit | Attending: Orthopedic Surgery | Admitting: Orthopedic Surgery

## 2015-05-25 DIAGNOSIS — Z7982 Long term (current) use of aspirin: Secondary | ICD-10-CM

## 2015-05-25 DIAGNOSIS — Z96641 Presence of right artificial hip joint: Secondary | ICD-10-CM | POA: Diagnosis present

## 2015-05-25 DIAGNOSIS — Z791 Long term (current) use of non-steroidal anti-inflammatories (NSAID): Secondary | ICD-10-CM | POA: Diagnosis not present

## 2015-05-25 DIAGNOSIS — F419 Anxiety disorder, unspecified: Secondary | ICD-10-CM | POA: Diagnosis present

## 2015-05-25 DIAGNOSIS — T84611A Infection and inflammatory reaction due to internal fixation device of left humerus, initial encounter: Secondary | ICD-10-CM | POA: Diagnosis present

## 2015-05-25 DIAGNOSIS — Y831 Surgical operation with implant of artificial internal device as the cause of abnormal reaction of the patient, or of later complication, without mention of misadventure at the time of the procedure: Secondary | ICD-10-CM | POA: Diagnosis present

## 2015-05-25 DIAGNOSIS — Z79890 Hormone replacement therapy (postmenopausal): Secondary | ICD-10-CM | POA: Diagnosis not present

## 2015-05-25 DIAGNOSIS — I10 Essential (primary) hypertension: Secondary | ICD-10-CM | POA: Diagnosis present

## 2015-05-25 DIAGNOSIS — F17220 Nicotine dependence, chewing tobacco, uncomplicated: Secondary | ICD-10-CM | POA: Diagnosis present

## 2015-05-25 DIAGNOSIS — J42 Unspecified chronic bronchitis: Secondary | ICD-10-CM | POA: Diagnosis present

## 2015-05-25 DIAGNOSIS — M009 Pyogenic arthritis, unspecified: Secondary | ICD-10-CM | POA: Diagnosis present

## 2015-05-25 DIAGNOSIS — M25512 Pain in left shoulder: Secondary | ICD-10-CM | POA: Diagnosis present

## 2015-05-25 DIAGNOSIS — Z9889 Other specified postprocedural states: Secondary | ICD-10-CM

## 2015-05-25 DIAGNOSIS — E669 Obesity, unspecified: Secondary | ICD-10-CM | POA: Diagnosis present

## 2015-05-25 DIAGNOSIS — Z6830 Body mass index (BMI) 30.0-30.9, adult: Secondary | ICD-10-CM | POA: Diagnosis not present

## 2015-05-25 DIAGNOSIS — M00812 Arthritis due to other bacteria, left shoulder: Secondary | ICD-10-CM | POA: Diagnosis not present

## 2015-05-25 DIAGNOSIS — B9689 Other specified bacterial agents as the cause of diseases classified elsewhere: Secondary | ICD-10-CM | POA: Diagnosis not present

## 2015-05-25 HISTORY — DX: Pain in unspecified joint: M25.50

## 2015-05-25 HISTORY — DX: Pneumonia, unspecified organism: J18.9

## 2015-05-25 HISTORY — PX: SHOULDER ARTHROSCOPY: SHX128

## 2015-05-25 HISTORY — PX: INCISION AND DRAINAGE: SHX5863

## 2015-05-25 HISTORY — DX: Anxiety disorder, unspecified: F41.9

## 2015-05-25 HISTORY — DX: Unspecified chronic bronchitis: J42

## 2015-05-25 HISTORY — DX: Migraine, unspecified, not intractable, without status migrainosus: G43.909

## 2015-05-25 LAB — COMPREHENSIVE METABOLIC PANEL
ALT: 45 U/L (ref 17–63)
AST: 42 U/L — AB (ref 15–41)
Albumin: 4.3 g/dL (ref 3.5–5.0)
Alkaline Phosphatase: 53 U/L (ref 38–126)
Anion gap: 10 (ref 5–15)
BUN: 19 mg/dL (ref 6–20)
CHLORIDE: 104 mmol/L (ref 101–111)
CO2: 27 mmol/L (ref 22–32)
CREATININE: 0.83 mg/dL (ref 0.61–1.24)
Calcium: 9.5 mg/dL (ref 8.9–10.3)
GFR calc Af Amer: >60 mL/min (ref >60)
GFR calc non Af Amer: >60 mL/min (ref >60)
Glucose, Bld: 107 mg/dL — ABNORMAL HIGH (ref 65–99)
Potassium: 3.9 mmol/L (ref 3.5–5.1)
SODIUM: 141 mmol/L (ref 135–145)
Total Bilirubin: 0.6 mg/dL (ref 0.3–1.2)
Total Protein: 6.8 g/dL (ref 6.5–8.1)

## 2015-05-25 LAB — PROTIME-INR
INR: 1.14 (ref 0.00–1.49)
Prothrombin Time: 14.8 seconds (ref 11.6–15.2)

## 2015-05-25 LAB — CBC WITH DIFFERENTIAL/PLATELET
BASOS ABS: 0 10*3/uL (ref 0.0–0.1)
Basophils Relative: 0 %
EOS ABS: 0.1 10*3/uL (ref 0.0–0.7)
EOS PCT: 2 %
HCT: 51.2 % (ref 39.0–52.0)
Hemoglobin: 16.2 g/dL (ref 13.0–17.0)
Lymphocytes Relative: 18 %
Lymphs Abs: 1.3 10*3/uL (ref 0.7–4.0)
MCH: 28.1 pg (ref 26.0–34.0)
MCHC: 31.6 g/dL (ref 30.0–36.0)
MCV: 88.7 fL (ref 78.0–100.0)
Monocytes Absolute: 0.5 10*3/uL (ref 0.1–1.0)
Monocytes Relative: 7 %
Neutro Abs: 5.5 10*3/uL (ref 1.7–7.7)
Neutrophils Relative %: 73 %
PLATELETS: 217 10*3/uL (ref 150–400)
RBC: 5.77 MIL/uL (ref 4.22–5.81)
RDW: 14.5 % (ref 11.5–15.5)
WBC: 7.5 10*3/uL (ref 4.0–10.5)

## 2015-05-25 LAB — GRAM STAIN

## 2015-05-25 LAB — SEDIMENTATION RATE: Sed Rate: 2 mm/hr (ref 0–16)

## 2015-05-25 LAB — C-REACTIVE PROTEIN: CRP: <0.5 mg/dL (ref <1.0)

## 2015-05-25 LAB — APTT: APTT: 31 s (ref 24–37)

## 2015-05-25 SURGERY — ARTHROSCOPY, SHOULDER
Anesthesia: General | Site: Shoulder | Laterality: Left

## 2015-05-25 MED ORDER — PROPOFOL 10 MG/ML IV BOLUS
INTRAVENOUS | Status: AC
  Filled 2015-05-25: qty 20

## 2015-05-25 MED ORDER — ACETAMINOPHEN 325 MG PO TABS: 650.0000 mg | ORAL_TABLET | Freq: Four times a day (QID) | ORAL | Status: DC | PRN

## 2015-05-25 MED ORDER — GLYCOPYRROLATE 0.2 MG/ML IJ SOLN
INTRAMUSCULAR | Status: DC | PRN
  Administered 2015-05-25: .4 mg via INTRAVENOUS

## 2015-05-25 MED ORDER — LISINOPRIL-HYDROCHLOROTHIAZIDE 20-12.5 MG PO TABS: 1.0000 | ORAL_TABLET | Freq: Every day | ORAL | Status: DC

## 2015-05-25 MED ORDER — LIDOCAINE 2% (20 MG/ML) 5 ML SYRINGE
INTRAMUSCULAR | Status: AC
  Filled 2015-05-25: qty 10

## 2015-05-25 MED ORDER — CEFAZOLIN SODIUM-DEXTROSE 2-4 GM/100ML-% IV SOLN
2.0000 g | Freq: Four times a day (QID) | INTRAVENOUS | Status: DC
  Administered 2015-05-25 – 2015-05-26 (×3): 2 g via INTRAVENOUS
  Filled 2015-05-25 (×6): qty 100

## 2015-05-25 MED ORDER — DOCUSATE SODIUM 100 MG PO CAPS
100.0000 mg | ORAL_CAPSULE | Freq: Two times a day (BID) | ORAL | Status: DC
  Administered 2015-05-25 – 2015-05-28 (×6): 100 mg via ORAL
  Filled 2015-05-25 (×6): qty 1

## 2015-05-25 MED ORDER — VANCOMYCIN HCL IN DEXTROSE 750-5 MG/150ML-% IV SOLN
750.0000 mg | Freq: Two times a day (BID) | INTRAVENOUS | Status: DC
  Administered 2015-05-25 – 2015-05-27 (×5): 750 mg via INTRAVENOUS
  Filled 2015-05-25 (×7): qty 150

## 2015-05-25 MED ORDER — ONDANSETRON HCL 4 MG/2ML IJ SOLN: 4.0000 mg | Freq: Four times a day (QID) | INTRAMUSCULAR | Status: DC | PRN

## 2015-05-25 MED ORDER — ZOLPIDEM TARTRATE 5 MG PO TABS
5.0000 mg | ORAL_TABLET | Freq: Every evening | ORAL | Status: DC | PRN
  Administered 2015-05-26 (×2): 5 mg via ORAL
  Filled 2015-05-25 (×2): qty 1

## 2015-05-25 MED ORDER — ALPRAZOLAM 0.5 MG PO TABS
0.5000 mg | ORAL_TABLET | Freq: Three times a day (TID) | ORAL | Status: DC | PRN
  Filled 2015-05-25: qty 1

## 2015-05-25 MED ORDER — ALBUTEROL SULFATE (2.5 MG/3ML) 0.083% IN NEBU: 2.5000 mg | INHALATION_SOLUTION | RESPIRATORY_TRACT | Status: DC | PRN

## 2015-05-25 MED ORDER — ONDANSETRON HCL 4 MG/2ML IJ SOLN
INTRAMUSCULAR | Status: DC | PRN
  Administered 2015-05-25: 4 mg via INTRAVENOUS

## 2015-05-25 MED ORDER — FENTANYL CITRATE (PF) 250 MCG/5ML IJ SOLN
INTRAMUSCULAR | Status: AC
  Filled 2015-05-25: qty 5

## 2015-05-25 MED ORDER — METHOCARBAMOL 500 MG PO TABS
500.0000 mg | ORAL_TABLET | Freq: Four times a day (QID) | ORAL | Status: DC | PRN
  Administered 2015-05-25 – 2015-05-28 (×11): 500 mg via ORAL
  Filled 2015-05-25 (×10): qty 1

## 2015-05-25 MED ORDER — ONDANSETRON HCL 4 MG PO TABS: 4.0000 mg | ORAL_TABLET | Freq: Four times a day (QID) | ORAL | Status: DC | PRN

## 2015-05-25 MED ORDER — LISINOPRIL 20 MG PO TABS
20.0000 mg | ORAL_TABLET | Freq: Every day | ORAL | Status: DC
  Administered 2015-05-25 – 2015-05-28 (×4): 20 mg via ORAL
  Filled 2015-05-25 (×4): qty 1

## 2015-05-25 MED ORDER — PHENOL 1.4 % MT LIQD: 1.0000 | OROMUCOSAL | Status: DC | PRN

## 2015-05-25 MED ORDER — DEXTROSE 5 % IV SOLN: 500.0000 mg | Freq: Four times a day (QID) | INTRAVENOUS | Status: DC | PRN

## 2015-05-25 MED ORDER — LACTATED RINGERS IV SOLN
INTRAVENOUS | Status: DC
  Administered 2015-05-25: 14:00:00 via INTRAVENOUS

## 2015-05-25 MED ORDER — ROCURONIUM BROMIDE 100 MG/10ML IV SOLN
INTRAVENOUS | Status: DC | PRN
  Administered 2015-05-25: 20 mg via INTRAVENOUS

## 2015-05-25 MED ORDER — KETOROLAC TROMETHAMINE 15 MG/ML IJ SOLN
15.0000 mg | Freq: Four times a day (QID) | INTRAMUSCULAR | Status: AC
Start: 2015-05-25 — End: 2015-05-26
  Administered 2015-05-25 – 2015-05-26 (×4): 15 mg via INTRAVENOUS
  Filled 2015-05-25 (×3): qty 1

## 2015-05-25 MED ORDER — FENTANYL CITRATE (PF) 100 MCG/2ML IJ SOLN
25.0000 ug | INTRAMUSCULAR | Status: DC | PRN
  Administered 2015-05-25 (×3): 50 ug via INTRAVENOUS

## 2015-05-25 MED ORDER — ALUM & MAG HYDROXIDE-SIMETH 200-200-20 MG/5ML PO SUSP: 30.0000 mL | ORAL | Status: DC | PRN

## 2015-05-25 MED ORDER — HYDROMORPHONE HCL 1 MG/ML IJ SOLN
1.0000 mg | INTRAMUSCULAR | Status: DC | PRN
  Administered 2015-05-25: 1 mg via INTRAVENOUS
  Administered 2015-05-26 – 2015-05-27 (×6): 2 mg via INTRAVENOUS
  Filled 2015-05-25 (×5): qty 2
  Filled 2015-05-25: qty 1
  Filled 2015-05-25: qty 2

## 2015-05-25 MED ORDER — ASPIRIN EC 325 MG PO TBEC
325.0000 mg | DELAYED_RELEASE_TABLET | Freq: Every day | ORAL | Status: DC
  Administered 2015-05-25 – 2015-05-28 (×4): 325 mg via ORAL
  Filled 2015-05-25 (×4): qty 1

## 2015-05-25 MED ORDER — MIDAZOLAM HCL 2 MG/2ML IJ SOLN
INTRAMUSCULAR | Status: AC
  Filled 2015-05-25: qty 2

## 2015-05-25 MED ORDER — NEOSTIGMINE METHYLSULFATE 5 MG/5ML IV SOSY
PREFILLED_SYRINGE | INTRAVENOUS | Status: AC
  Filled 2015-05-25: qty 5

## 2015-05-25 MED ORDER — METHOCARBAMOL 500 MG PO TABS
ORAL_TABLET | ORAL | Status: AC
  Filled 2015-05-25: qty 1

## 2015-05-25 MED ORDER — CHLORHEXIDINE GLUCONATE 4 % EX LIQD: 60.0000 mL | Freq: Once | CUTANEOUS | Status: DC

## 2015-05-25 MED ORDER — EPHEDRINE SULFATE 50 MG/ML IJ SOLN
INTRAMUSCULAR | Status: DC | PRN
  Administered 2015-05-25 (×3): 5 mg via INTRAVENOUS

## 2015-05-25 MED ORDER — FENTANYL CITRATE (PF) 100 MCG/2ML IJ SOLN
INTRAMUSCULAR | Status: AC
  Administered 2015-05-25: 50 ug via INTRAVENOUS
  Filled 2015-05-25: qty 2

## 2015-05-25 MED ORDER — PROPOFOL 10 MG/ML IV BOLUS
INTRAVENOUS | Status: DC | PRN
  Administered 2015-05-25: 30 mg via INTRAVENOUS

## 2015-05-25 MED ORDER — ZOLPIDEM TARTRATE 5 MG PO TABS: 10.0000 mg | ORAL_TABLET | Freq: Every evening | ORAL | Status: DC | PRN

## 2015-05-25 MED ORDER — NEOSTIGMINE METHYLSULFATE 10 MG/10ML IV SOLN
INTRAVENOUS | Status: DC | PRN
  Administered 2015-05-25: 3 mg via INTRAVENOUS

## 2015-05-25 MED ORDER — CEFAZOLIN SODIUM-DEXTROSE 2-4 GM/100ML-% IV SOLN
INTRAVENOUS | Status: AC
  Administered 2015-05-25: 2 g via INTRAVENOUS
  Filled 2015-05-25: qty 100

## 2015-05-25 MED ORDER — METOCLOPRAMIDE HCL 5 MG/ML IJ SOLN: 10.0000 mg | Freq: Once | INTRAMUSCULAR | Status: DC | PRN

## 2015-05-25 MED ORDER — KETOROLAC TROMETHAMINE 15 MG/ML IJ SOLN
INTRAMUSCULAR | Status: AC
  Filled 2015-05-25: qty 1

## 2015-05-25 MED ORDER — MEPERIDINE HCL 25 MG/ML IJ SOLN: 6.2500 mg | INTRAMUSCULAR | Status: DC | PRN

## 2015-05-25 MED ORDER — ONDANSETRON HCL 4 MG/2ML IJ SOLN
INTRAMUSCULAR | Status: AC
  Filled 2015-05-25: qty 2

## 2015-05-25 MED ORDER — 0.9 % SODIUM CHLORIDE (POUR BTL) OPTIME
TOPICAL | Status: DC | PRN
  Administered 2015-05-25: 1000 mL

## 2015-05-25 MED ORDER — OXYCODONE HCL 5 MG PO TABS
ORAL_TABLET | ORAL | Status: AC
  Filled 2015-05-25: qty 3

## 2015-05-25 MED ORDER — FENTANYL CITRATE (PF) 100 MCG/2ML IJ SOLN
INTRAMUSCULAR | Status: DC | PRN
  Administered 2015-05-25 (×2): 50 ug via INTRAVENOUS
  Administered 2015-05-25 (×4): 100 ug via INTRAVENOUS

## 2015-05-25 MED ORDER — ACETAMINOPHEN 650 MG RE SUPP: 650.0000 mg | Freq: Four times a day (QID) | RECTAL | Status: DC | PRN

## 2015-05-25 MED ORDER — OXYCODONE HCL 5 MG PO TABS
15.0000 mg | ORAL_TABLET | Freq: Four times a day (QID) | ORAL | Status: DC | PRN
  Administered 2015-05-25: 15 mg via ORAL

## 2015-05-25 MED ORDER — DEXTROSE-NACL 5-0.45 % IV SOLN: INTRAVENOUS | Status: DC

## 2015-05-25 MED ORDER — GLYCOPYRROLATE 0.2 MG/ML IV SOSY
PREFILLED_SYRINGE | INTRAVENOUS | Status: AC
  Filled 2015-05-25: qty 3

## 2015-05-25 MED ORDER — ARTIFICIAL TEARS OP OINT
TOPICAL_OINTMENT | OPHTHALMIC | Status: DC | PRN
Start: 2015-05-25 — End: 2015-05-25
  Administered 2015-05-25: 1 via OPHTHALMIC

## 2015-05-25 MED ORDER — LIDOCAINE HCL (CARDIAC) 20 MG/ML IV SOLN
INTRAVENOUS | Status: DC | PRN
  Administered 2015-05-25: 80 mg via INTRAVENOUS

## 2015-05-25 MED ORDER — LACTATED RINGERS IV SOLN
INTRAVENOUS | Status: DC | PRN
  Administered 2015-05-25 (×2): via INTRAVENOUS

## 2015-05-25 MED ORDER — OXYCODONE HCL 5 MG PO TABS
15.0000 mg | ORAL_TABLET | ORAL | Status: DC | PRN
  Administered 2015-05-25 – 2015-05-28 (×17): 15 mg via ORAL
  Filled 2015-05-25 (×17): qty 3

## 2015-05-25 MED ORDER — MIDAZOLAM HCL 5 MG/5ML IJ SOLN
INTRAMUSCULAR | Status: DC | PRN
Start: 2015-05-25 — End: 2015-05-25
  Administered 2015-05-25: 2 mg via INTRAVENOUS

## 2015-05-25 MED ORDER — HYDROCHLOROTHIAZIDE 12.5 MG PO CAPS
12.5000 mg | ORAL_CAPSULE | Freq: Every day | ORAL | Status: DC
  Administered 2015-05-25 – 2015-05-28 (×4): 12.5 mg via ORAL
  Filled 2015-05-25 (×4): qty 1

## 2015-05-25 MED ORDER — MENTHOL 3 MG MT LOZG: 1.0000 | LOZENGE | OROMUCOSAL | Status: DC | PRN

## 2015-05-25 MED ORDER — OXYCODONE HCL 15 MG PO TABS: 15.0000 mg | ORAL_TABLET | Freq: Four times a day (QID) | ORAL | Status: AC | PRN

## 2015-05-25 SURGICAL SUPPLY — 65 items
BENZOIN TINCTURE PRP APPL 2/3 (GAUZE/BANDAGES/DRESSINGS) IMPLANT
BLADE CUTTER GATOR 3.5 (BLADE) IMPLANT
BLADE GREAT WHITE 4.2 (BLADE) ×2 IMPLANT
BLADE GREAT WHITE 4.2MM (BLADE) ×1
BLADE SURG 11 STRL SS (BLADE) ×3 IMPLANT
BOOTCOVER CLEANROOM LRG (PROTECTIVE WEAR) ×6 IMPLANT
BUR VERTEX HOODED 4.5 (BURR) IMPLANT
CLOSURE WOUND 1/2 X4 (GAUZE/BANDAGES/DRESSINGS)
CONT SPEC 4OZ CLIKSEAL STRL BL (MISCELLANEOUS) ×3 IMPLANT
COVER SURGICAL LIGHT HANDLE (MISCELLANEOUS) ×3 IMPLANT
DRAIN CHANNEL 10F 3/8 F FF (DRAIN) ×3 IMPLANT
DRAPE IMP U-DRAPE 54X76 (DRAPES) ×3 IMPLANT
DRAPE STERI 35X30 U-POUCH (DRAPES) ×3 IMPLANT
DRAPE SURG 17X23 STRL (DRAPES) ×3 IMPLANT
DRAPE U-SHAPE 47X51 STRL (DRAPES) ×9 IMPLANT
DRSG EMULSION OIL 3X3 NADH (GAUZE/BANDAGES/DRESSINGS) ×3 IMPLANT
DRSG PAD ABDOMINAL 8X10 ST (GAUZE/BANDAGES/DRESSINGS) ×3 IMPLANT
DURAPREP 26ML APPLICATOR (WOUND CARE) ×3 IMPLANT
ELECT MENISCUS 165MM 90D (ELECTRODE) IMPLANT
ELECT REM PT RETURN 9FT ADLT (ELECTROSURGICAL) ×3
ELECTRODE REM PT RTRN 9FT ADLT (ELECTROSURGICAL) ×1 IMPLANT
EVACUATOR SILICONE 100CC (DRAIN) ×3 IMPLANT
FILTER STRAW FLUID ASPIR (MISCELLANEOUS) IMPLANT
GAUZE SPONGE 4X4 12PLY STRL (GAUZE/BANDAGES/DRESSINGS) ×3 IMPLANT
GAUZE XEROFORM 1X8 LF (GAUZE/BANDAGES/DRESSINGS) IMPLANT
GLOVE BIOGEL PI IND STRL 8 (GLOVE) ×2 IMPLANT
GLOVE BIOGEL PI INDICATOR 8 (GLOVE) ×4
GLOVE ECLIPSE 7.5 STRL STRAW (GLOVE) ×6 IMPLANT
GOWN STRL REUS W/ TWL LRG LVL3 (GOWN DISPOSABLE) ×1 IMPLANT
GOWN STRL REUS W/ TWL XL LVL3 (GOWN DISPOSABLE) ×1 IMPLANT
GOWN STRL REUS W/TWL LRG LVL3 (GOWN DISPOSABLE) ×2
GOWN STRL REUS W/TWL XL LVL3 (GOWN DISPOSABLE) ×2
KIT BASIN OR (CUSTOM PROCEDURE TRAY) ×3 IMPLANT
KIT ROOM TURNOVER OR (KITS) ×3 IMPLANT
MANIFOLD NEPTUNE II (INSTRUMENTS) ×3 IMPLANT
NEEDLE 18GX1X1/2 (RX/OR ONLY) (NEEDLE) IMPLANT
NEEDLE 22X1 1/2 (OR ONLY) (NEEDLE) IMPLANT
NEEDLE SPNL 18GX3.5 QUINCKE PK (NEEDLE) ×3 IMPLANT
NS IRRIG 1000ML POUR BTL (IV SOLUTION) ×3 IMPLANT
PACK SHOULDER (CUSTOM PROCEDURE TRAY) ×3 IMPLANT
PACK UNIVERSAL I (CUSTOM PROCEDURE TRAY) IMPLANT
PAD ARMBOARD 7.5X6 YLW CONV (MISCELLANEOUS) ×6 IMPLANT
PENCIL BUTTON HOLSTER BLD 10FT (ELECTRODE) ×3 IMPLANT
SET ARTHROSCOPY TUBING (MISCELLANEOUS) ×2
SET ARTHROSCOPY TUBING LN (MISCELLANEOUS) ×1 IMPLANT
SLING ARM FOAM STRAP LRG (SOFTGOODS) ×3 IMPLANT
SPONGE LAP 4X18 X RAY DECT (DISPOSABLE) IMPLANT
STRIP CLOSURE SKIN 1/2X4 (GAUZE/BANDAGES/DRESSINGS) IMPLANT
SUT ETHILON 2 0 PSLX (SUTURE) ×3 IMPLANT
SUT ETHILON 3 0 PS 1 (SUTURE) ×3 IMPLANT
SUT ETHILON 4 0 PS 2 18 (SUTURE) IMPLANT
SUT VIC AB 1 CT1 27 (SUTURE) ×2
SUT VIC AB 1 CT1 27XBRD ANBCTR (SUTURE) ×1 IMPLANT
SUT VIC AB 2-0 CT1 27 (SUTURE) ×2
SUT VIC AB 2-0 CT1 TAPERPNT 27 (SUTURE) ×1 IMPLANT
SWAB CULTURE LIQUID MINI MALE (MISCELLANEOUS) ×3 IMPLANT
SYR 5ML LL (SYRINGE) IMPLANT
SYR CONTROL 10ML LL (SYRINGE) IMPLANT
TOWEL OR 17X24 6PK STRL BLUE (TOWEL DISPOSABLE) ×3 IMPLANT
TOWEL OR 17X26 10 PK STRL BLUE (TOWEL DISPOSABLE) ×3 IMPLANT
TUBE CONNECTING 12'X1/4 (SUCTIONS) ×1
TUBE CONNECTING 12X1/4 (SUCTIONS) ×2 IMPLANT
WAND HAND CNTRL MULTIVAC 90 (MISCELLANEOUS) IMPLANT
WATER STERILE IRR 1000ML POUR (IV SOLUTION) ×3 IMPLANT
YANKAUER SUCT BULB TIP NO VENT (SUCTIONS) ×3 IMPLANT

## 2015-05-25 NOTE — Anesthesia Postprocedure Evaluation (Signed)
Anesthesia Post Note  Patient: John Simpson  Procedure(s) Performed: Procedure(s) (LRB): SHOULDER ARTHROSCOPY WITH INCISION AND DRAINAGE (Left)  Patient location during evaluation: PACU Anesthesia Type: General Level of consciousness: awake and alert and patient cooperative Pain management: pain level controlled Vital Signs Assessment: post-procedure vital signs reviewed and stable Respiratory status: spontaneous breathing and respiratory function stable Cardiovascular status: stable Anesthetic complications: no    Last Vitals:  Filed Vitals:   05/25/15 1800 05/25/15 1815  BP: 154/97 158/96  Pulse: 57 66  Temp:    Resp: 12 15    Last Pain:  Filed Vitals:   05/25/15 1826  PainSc: 6                  Elbert Spickler S

## 2015-05-25 NOTE — Anesthesia Preprocedure Evaluation (Addendum)
Anesthesia Evaluation  Patient identified by MRN, date of birth, ID band Patient awake    Reviewed: Allergy & Precautions, NPO status , Patient's Chart, lab work & pertinent test results  Airway Mallampati: II  TM Distance: >3 FB Neck ROM: Full    Dental no notable dental hx. (+) Caps   Pulmonary pneumonia, resolved,    Pulmonary exam normal breath sounds clear to auscultation       Cardiovascular hypertension, Pt. on medications Normal cardiovascular exam Rhythm:Regular Rate:Normal     Neuro/Psych  Headaches, Anxiety    GI/Hepatic negative GI ROS, Neg liver ROS,   Endo/Other  Obesity  Renal/GU negative Renal ROS  negative genitourinary   Musculoskeletal  (+) Arthritis , Osteoarthritis,  Post Op Wound Infection   Abdominal (+) + obese,   Peds  Hematology negative hematology ROS (+)   Anesthesia Other Findings   Reproductive/Obstetrics                            Anesthesia Physical Anesthesia Plan  ASA: II  Anesthesia Plan: General   Post-op Pain Management:    Induction: Intravenous  Airway Management Planned: Oral ETT and LMA  Additional Equipment:   Intra-op Plan:   Post-operative Plan: Extubation in OR  Informed Consent: I have reviewed the patients History and Physical, chart, labs and discussed the procedure including the risks, benefits and alternatives for the proposed anesthesia with the patient or authorized representative who has indicated his/her understanding and acceptance.   Dental advisory given  Plan Discussed with: CRNA, Anesthesiologist and Surgeon  Anesthesia Plan Comments:         Anesthesia Quick Evaluation

## 2015-05-25 NOTE — Anesthesia Procedure Notes (Signed)
Procedure Name: Intubation Date/Time: 05/25/2015 3:36 PM Performed by: Izora Gala Pre-anesthesia Checklist: Patient identified, Emergency Drugs available, Suction available and Patient being monitored Patient Re-evaluated:Patient Re-evaluated prior to inductionOxygen Delivery Method: Circle system utilized Preoxygenation: Pre-oxygenation with 100% oxygen Intubation Type: IV induction Ventilation: Mask ventilation without difficulty Laryngoscope Size: Miller and 3 Grade View: Grade II Tube type: Oral Tube size: 7.5 mm Number of attempts: 1 Airway Equipment and Method: Stylet and LTA kit utilized Placement Confirmation: ETT inserted through vocal cords under direct vision and positive ETCO2 Secured at: 21 cm Tube secured with: Tape Dental Injury: Teeth and Oropharynx as per pre-operative assessment

## 2015-05-25 NOTE — H&P (Signed)
PREOPERATIVE H&P  Chief Complaint: left shoulder persistent drainage status post mini open rotator cuff repair  HPI: John Simpson is a 65 y.o. male who presents for evaluation of left shoulder status post previous rotator cuff repair and biceps tenodesis with persistent drainage having failed antibiotic treatment and local I&D in the office. It has been present for 3 weeks and has been worsening. He has failed conservative measures. Pain is rated as mild.  Past Medical History  Diagnosis Date  . Arthritis   . Anxiety     takes Xanax daily as needed  . History of migraine     takes Imitrex daily as needed  . Hypertension     takes Lisinopril-HCTZ daily   . Pneumonia     hx of-as a baby  . History of bronchitis 2016  . Joint pain    Past Surgical History  Procedure Laterality Date  . Total hip arthroplasty  2008    right  . Wisdom tooth extraction    . Tonsillectomy    . Colonoscopy    . Vasectomy    . Ankle arthroscopy  2011    right  . Back surgery  2005    lumb diskectomy  . Ankle arthroscopy  02/01/2012    Procedure: ANKLE ARTHROSCOPY;  Surgeon: Sherri RadPaul A Bednarz, MD;  Location: [facility];  Service: Orthopedics;  Laterality: Right;  WITH EXTENSIVE DEBRIDEMENT  . Gastrocnemius recession  02/01/2012    Procedure: GASTROCNEMIUS SLIDE;  Surgeon: Sherri RadPaul A Bednarz, MD;  Location: [facility];  Service: Orthopedics;  Laterality: Right;  . Calcaneal osteotomy with tarsal metatarsal fusion  02/01/2012    Procedure: CALCANEAL OSTEOTOMY WITH TARSAL METATARSAL FUSION;  Surgeon: Sherri RadPaul A Bednarz, MD;  Location:  SURGERY CENTER;  Service: Orthopedics;  Laterality: Right;  MEDIALIZING CALCANEAL OSTEOTOMY, 2ND TMT JOINT FUSION WITHOUT OSTEOTOMY, LOCAL BONE GRAFT, STRESS X-RAYS FOOT, POSSIBLE INTERCUNEIFORM FUSION    Social History   Social History  . Marital Status: Married    Spouse Name: N/A  . Number of Children: N/A  . Years of Education: N/A    Social History Main Topics  . Smoking status: Never Smoker   . Smokeless tobacco: Current User    Types: Chew  . Alcohol Use: 0.0 oz/week    1-2 Glasses of wine per week     Comment: wine most nights  . Drug Use: No  . Sexual Activity: Yes   Other Topics Concern  . None   Social History Narrative   History reviewed. No pertinent family history. No Known Allergies Prior to Admission medications   Medication Sig Start Date End Date Taking? Authorizing Provider  ALPRAZolam Prudy Feeler(XANAX) 0.5 MG tablet Take 0.5 mg by mouth 3 (three) times daily as needed for anxiety or sleep.  05/06/15  Yes Historical Provider, MD  aspirin 81 MG EC tablet Take 81 mg by mouth daily.    Yes Historical Provider, MD  Cholecalciferol (VITAMIN D) 2000 units tablet Take 2,000 Units by mouth daily.    Yes Historical Provider, MD  diclofenac (VOLTAREN) 75 MG EC tablet Take 75 mg by mouth 2 (two) times daily. 05/14/15  Yes Historical Provider, MD  fish oil-omega-3 fatty acids 1000 MG capsule Take 2 g by mouth daily.   Yes Historical Provider, MD  fluticasone (FLONASE) 50 MCG/ACT nasal spray Place 1 spray into both nostrils daily as needed for allergies.  03/09/15  Yes Historical Provider, MD  HYDROcodone-acetaminophen (NORCO) 10-325 MG tablet Take  1 tablet by mouth every 4 (four) hours as needed for moderate pain.  05/20/15  Yes Historical Provider, MD  lisinopril-hydrochlorothiazide (PRINZIDE,ZESTORETIC) 20-12.5 MG per tablet Take 1 tablet by mouth daily.   Yes Historical Provider, MD  methocarbamol (ROBAXIN) 500 MG tablet Take 500 mg by mouth daily as needed for muscle spasms.  11/20/14  Yes Historical Provider, MD  Multiple Vitamins-Minerals (MULTIVITAMIN WITH MINERALS) tablet Take 1 tablet by mouth daily.   Yes Historical Provider, MD  oxyCODONE (ROXICODONE) 15 MG immediate release tablet Take 15 mg by mouth every 8 (eight) hours as needed for pain.  04/28/15  Yes Historical Provider, MD  SUMAtriptan (IMITREX) 100 MG  tablet 1 tablet. 01/06/15  Yes Historical Provider, MD  testosterone cypionate (DEPOTESTOSTERONE CYPIONATE) 200 MG/ML injection Inject 200 mg into the muscle every 14 (fourteen) days.  05/11/15  Yes Historical Provider, MD  VENTOLIN HFA 108 (90 Base) MCG/ACT inhaler Inhale 2 puffs into the lungs every 4 (four) hours as needed for wheezing or shortness of breath.  03/09/15  Yes Historical Provider, MD  vitamin C (ASCORBIC ACID) 500 MG tablet Take 1 tablet (500 mg total) by mouth daily. 02/01/12  Yes Kirtland Bouchard, PA-C  zolpidem (AMBIEN) 10 MG tablet Take 10 mg by mouth at bedtime as needed for sleep.  04/23/15  Yes Historical Provider, MD     Positive ROS: none  All other systems have been reviewed and were otherwise negative with the exception of those mentioned in the HPI and as above.  Physical Exam: There were no vitals filed for this visit.  General: Alert, no acute distress Cardiovascular: No pedal edema Respiratory: No cyanosis, no use of accessory musculature GI: No organomegaly, abdomen is soft and non-tender Skin: No lesions in the area of chief complaint Neurologic: Sensation intact distally Psychiatric: Patient is competent for consent with normal mood and affect Lymphatic: No axillary or cervical lymphadenopathy  MUSCULOSKELETAL: left shoulder: Positive erythema over the lateral incision.  Moderate soft tissue swelling.  Minimal pain to range of motion.  Persistent limitation of range of motion.  Assessment/Plan: Status post left shoulder arthroscopy with persistent drainage from the lateral wound and moderate erythema Plan for Procedure(s): Left SHOULDER ARTHROSCOPY WITH INCISION AND DRAINAGE of lateral wound  The risks benefits and alternatives were discussed with the patient including but not limited to the risks of nonoperative treatment, versus surgical intervention including infection, bleeding, nerve injury, malunion, nonunion, hardware prominence, hardware failure,  need for hardware removal, blood clots, cardiopulmonary complications, morbidity, mortality, among others, and they were willing to proceed.  Predicted outcome is good, although there will be at least a six to nine month expected recovery.  Jaydan Chretien L, MD 05/25/2015 6:35 AM

## 2015-05-25 NOTE — Transfer of Care (Signed)
Immediate Anesthesia Transfer of Care Note  Patient: John Simpson  Procedure(s) Performed: Procedure(s): SHOULDER ARTHROSCOPY WITH INCISION AND DRAINAGE (Left)  Patient Location: PACU  Anesthesia Type:General  Level of Consciousness: awake, alert , oriented and patient cooperative  Airway & Oxygen Therapy: Patient Spontanous Breathing and Patient connected to nasal cannula oxygen  Post-op Assessment: Report given to RN, Post -op Vital signs reviewed and stable and Patient moving all extremities  Post vital signs: Reviewed and stable  Last Vitals:  Filed Vitals:   05/25/15 1324 05/25/15 1720  BP: 161/93   Pulse: 72   Temp: 37.1 C   Resp: 20 18    Last Pain:  Filed Vitals:   05/25/15 1726  PainSc: 6       Patients Stated Pain Goal: 4 (05/25/15 1335)  Complications: No apparent anesthesia complications

## 2015-05-25 NOTE — Progress Notes (Signed)
Pharmacy Antibiotic Note  John Simpson is a 65 y.o. male admitted on 05/25/2015 with left shoulder pain and persistent drainage. Now s/p I&D and parmacy has been consulted for vancomycin dosing for wound infection of left shoulder. Afebrile, WBC wnl. SCr stable, normalized CrCl ~6880ml/min.  Plan: Continue cefazolin 2g IV Q6 per MD (x 8 doses postop) Start vancomycin 750mg  IV Q12 Monitor clinical picture, renal function, VT prn F/U C&S, abx deescalation / LOT   Height: 5\' 11"  (180.3 cm) Weight: 222 lb (100.699 kg) IBW/kg (Calculated) : 75.3  Temp (24hrs), Avg:98.6 F (37 C), Min:98.4 F (36.9 C), Max:98.8 F (37.1 C)   Recent Labs Lab 05/25/15 1344  WBC 7.5  CREATININE 0.83    Estimated Creatinine Clearance: 111.6 mL/min (by C-G formula based on Cr of 0.83).    No Known Allergies  Antimicrobials this admission: Cefazolin 5/8 >>  Vancomycin 5/8 >>   Dose adjustments this admission: n/a  Microbiology results: 5/8 Body fluid cx > sent 5/8 Tissue cx > sent  Thank you for allowing pharmacy to be a part of this patient's care.  John Simpson,John Simpson 05/25/2015 9:27 PM

## 2015-05-25 NOTE — Brief Op Note (Signed)
05/25/2015  4:51 PM  PATIENT:  Tillman AbideJames L Broce  65 y.o. male  PRE-OPERATIVE DIAGNOSIS:  LEFT SHOULDER ARTHROSCOPY WITH INCISION AND DRAINAGE.   POST-OPERATIVE DIAGNOSIS:  LEFT SHOULDER ARTHROSCOPY WITH INCISION  PROCEDURE:  Procedure(s): SHOULDER ARTHROSCOPY WITH INCISION AND DRAINAGE (Left)  SURGEON:  Surgeon(s) and Role:    * Jodi GeraldsJohn Jemima Petko, MD - Primary  PHYSICIAN ASSISTANT:   ASSISTANTS: bethune   ANESTHESIA:   general  EBL:  Total I/O In: 1500 [I.V.:1500] Out: 60 [Blood:60]  BLOOD ADMINISTERED:none  DRAINS: (1 med) Jackson-Pratt drain(s) with closed bulb suction in the l shoulder   LOCAL MEDICATIONS USED:  NONE  SPECIMEN:  Source of Specimen:  bursae and tissue l subacromial space  DISPOSITION OF SPECIMEN:  micro lab  COUNTS:  YES  TOURNIQUET:  * No tourniquets in log *  DICTATION: .Other Dictation: Dictation Number [id number]  PLAN OF CARE: Admit to inpatient   PATIENT DISPOSITION:  PACU - hemodynamically stable.   Delay start of Pharmacological VTE agent (>24hrs) due to surgical blood loss or risk of bleeding: no

## 2015-05-25 NOTE — Discharge Instructions (Signed)
Wear sling    

## 2015-05-26 ENCOUNTER — Encounter (HOSPITAL_COMMUNITY): Payer: Self-pay | Admitting: Orthopedic Surgery

## 2015-05-26 DIAGNOSIS — M00812 Arthritis due to other bacteria, left shoulder: Secondary | ICD-10-CM

## 2015-05-26 DIAGNOSIS — Z9889 Other specified postprocedural states: Secondary | ICD-10-CM

## 2015-05-26 DIAGNOSIS — B9689 Other specified bacterial agents as the cause of diseases classified elsewhere: Secondary | ICD-10-CM

## 2015-05-26 LAB — CBC WITH DIFFERENTIAL/PLATELET
Basophils Absolute: 0 10*3/uL (ref 0.0–0.1)
Basophils Relative: 0 %
EOS PCT: 3 %
Eosinophils Absolute: 0.2 10*3/uL (ref 0.0–0.7)
HCT: 41.8 % (ref 39.0–52.0)
Hemoglobin: 13.3 g/dL (ref 13.0–17.0)
LYMPHS ABS: 1.8 10*3/uL (ref 0.7–4.0)
LYMPHS PCT: 25 %
MCH: 29 pg (ref 26.0–34.0)
MCHC: 31.8 g/dL (ref 30.0–36.0)
MCV: 91.1 fL (ref 78.0–100.0)
MONO ABS: 0.6 10*3/uL (ref 0.1–1.0)
MONOS PCT: 8 %
Neutro Abs: 4.8 10*3/uL (ref 1.7–7.7)
Neutrophils Relative %: 64 %
PLATELETS: 216 10*3/uL (ref 150–400)
RBC: 4.59 MIL/uL (ref 4.22–5.81)
RDW: 15.1 % (ref 11.5–15.5)
WBC: 7.4 10*3/uL (ref 4.0–10.5)

## 2015-05-26 MED ORDER — CEFTRIAXONE SODIUM 2 G IJ SOLR
2.0000 g | INTRAMUSCULAR | Status: DC
  Administered 2015-05-26 – 2015-05-28 (×3): 2 g via INTRAVENOUS
  Filled 2015-05-26 (×3): qty 2

## 2015-05-26 NOTE — Consult Note (Signed)
[facility]  Date of Admission:  05/25/2015  Date of Consult:  05/26/2015  Reason for Consult:Septic Arthritis Referring Physician: Graves  Impression/Recommendation Septic Arthritis  Will place pic (discussed with pt) Continue vanco Add ceftriaxone  Comment await this Cx to make definitive recc about his atbx. Suspect he will need 4 weeks.   Thank you so much for this interesting consult,   Bobby Rumpf (pager) [phone] [email or web address]  John Simpson is an 65 y.o. male.  HPI: 65 yo M with hx of HTN, prev rotator cuff repair and biceps tenodesis (04-03-15). He returned to MD office with worsening swelling. He had a shoulder aspirate done on 4-24 which was negative.  He was given a course of keflex tid for 7 days which did not improve his sx.  He returned with d/c from his wound and was brought to OR on 5-8.   Past Medical History  Diagnosis Date  . Hypertension     takes Lisinopril-HCTZ daily   . Pneumonia     hx of-as a baby  . Joint pain   . Migraine     takes Imitrex daily as needed  . Chronic bronchitis ([place])     "get it pretty much q yr" (05/25/2015)  . Arthritis     "most qwhere" (05/25/2015)  . Anxiety     takes Xanax as needed (05/25/2015)    Past Surgical History  Procedure Laterality Date  . Total hip arthroplasty Right 2008  . Wisdom tooth extraction    . Tonsillectomy    . Colonoscopy    . Vasectomy    . Ankle arthroscopy Right 2011  . Ankle arthroscopy  02/01/2012    Procedure: ANKLE ARTHROSCOPY;  Surgeon: Colin Rhein, MD;  Location: [place];  Service: Orthopedics;  Laterality: Right;  WITH EXTENSIVE DEBRIDEMENT  . Gastrocnemius recession  02/01/2012    Procedure: GASTROCNEMIUS SLIDE;  Surgeon: Colin Rhein, MD;  Location: [place];  Service: Orthopedics;  Laterality: Right;  . Calcaneal osteotomy with tarsal metatarsal fusion  02/01/2012    Procedure: CALCANEAL OSTEOTOMY  WITH TARSAL METATARSAL FUSION;  Surgeon: Colin Rhein, MD;  Location: [place];  Service: Orthopedics;  Laterality: Right;  MEDIALIZING CALCANEAL OSTEOTOMY, 2ND TMT JOINT FUSION WITHOUT OSTEOTOMY, LOCAL BONE GRAFT, STRESS X-RAYS FOOT, POSSIBLE INTERCUNEIFORM FUSION   . Joint replacement    . Laminectomy and microdiscectomy lumbar spine  2005  . Back surgery    . Shoulder open rotator cuff repair Left   . Incision and drainage Left 05/25/2015    "shoulder"  . Shoulder arthroscopy Left 05/25/2015    Procedure: SHOULDER ARTHROSCOPY WITH INCISION AND DRAINAGE;  Surgeon: Dorna Leitz, MD;  Location: [place];  Service: Orthopedics;  Laterality: Left;     No Known Allergies  Medications:  Scheduled: . aspirin EC  325 mg Oral Daily  .  ceFAZolin (ANCEF) IV  2 g Intravenous Q6H  . docusate sodium  100 mg Oral BID  . lisinopril  20 mg Oral Daily   And  . hydrochlorothiazide  12.5 mg Oral Daily  . vancomycin  750 mg Intravenous Q12H    Abtx:  Anti-infectives    Start     Dose/Rate Route Frequency Ordered Stop   05/25/15 2300  ceFAZolin (ANCEF) IVPB 2g/100 mL premix     2 g 200 mL/hr over 30 Minutes Intravenous Every 6 hours 05/25/15 1902 05/27/15 2259   05/25/15 2200  vancomycin (VANCOCIN) IVPB 750 mg/150 ml premix     750 mg 150 mL/hr over 60 Minutes Intravenous Every 12 hours 05/25/15 2132     05/25/15 1317  ceFAZolin (ANCEF) 2-4 GM/100ML-% IVPB    Comments:  Ronnald Ramp, Tom[place]   : cabinet override      05/25/15 1317 05/25/15 1657      Total days of antibiotics: 1 vanco          Social History:  reports that he has never smoked. His smokeless tobacco use includes Chew. He reports that he drinks about 2.4 oz of alcohol per week. He reports that he does not use illicit drugs.  History reviewed. No pertinent family history.  General ROS: no f/c, no proximal erythema. no loose BM, normal urination. see HPI  Blood pressure 117/67, pulse 65, temperature 98.6 F (37 C),  temperature source Oral, resp. rate 18, height 5' 11"  (1.803 m), weight 100.699 kg (222 lb), SpO2 97 %. General appearance: alert, cooperative and no distress Eyes: negative findings: conjunctivae and sclerae normal and pupils equal, round, reactive to light and accomodation Throat: lips, mucosa, and tongue normal; teeth and gums normal Neck: no adenopathy and supple, symmetrical, trachea midline Lungs: clear to auscultation bilaterally Heart: regular rate and rhythm Abdomen: normal findings: bowel sounds normal and soft, non-tender Extremities: edema none LE and L shoulder dressed. no proximal erythema.    Results for orders placed or performed during the hospital encounter of 05/25/15 (from the past 48 hour(s))  APTT     Status: None   Collection Time: 05/25/15  1:44 PM  Result Value Ref Range   aPTT 31 24 - 37 seconds  CBC WITH DIFFERENTIAL     Status: None   Collection Time: 05/25/15  1:44 PM  Result Value Ref Range   WBC 7.5 4.0 - 10.5 K/uL   RBC 5.77 4.22 - 5.81 MIL/uL   Hemoglobin 16.2 13.0 - 17.0 g/dL   HCT 51.2 39.0 - 52.0 %   MCV 88.7 78.0 - 100.0 fL   MCH 28.1 26.0 - 34.0 pg   MCHC 31.6 30.0 - 36.0 g/dL   RDW 14.5 11.5 - 15.5 %   Platelets 217 150 - 400 K/uL   Neutrophils Relative % 73 %   Neutro Abs 5.5 1.7 - 7.7 K/uL   Lymphocytes Relative 18 %   Lymphs Abs 1.3 0.7 - 4.0 K/uL   Monocytes Relative 7 %   Monocytes Absolute 0.5 0.1 - 1.0 K/uL   Eosinophils Relative 2 %   Eosinophils Absolute 0.1 0.0 - 0.7 K/uL   Basophils Relative 0 %   Basophils Absolute 0.0 0.0 - 0.1 K/uL  Comprehensive metabolic panel     Status: Abnormal   Collection Time: 05/25/15  1:44 PM  Result Value Ref Range   Sodium 141 135 - 145 mmol/L   Potassium 3.9 3.5 - 5.1 mmol/L   Chloride 104 101 - 111 mmol/L   CO2 27 22 - 32 mmol/L   Glucose, Bld 107 (H) 65 - 99 mg/dL   BUN 19 6 - 20 mg/dL   Creatinine, Ser 0.83 0.61 - 1.24 mg/dL   Calcium 9.5 8.9 - 10.3 mg/dL   Total Protein 6.8 6.5 -  8.1 g/dL   Albumin 4.3 3.5 - 5.0 g/dL   AST 42 (H) 15 - 41 U/L   ALT 45 17 - 63 U/L   Alkaline Phosphatase 53 38 - 126 U/L   Total Bilirubin 0.6 0.3 - 1.2 mg/dL  GFR calc non Af Amer >60 >60 mL/min   GFR calc Af Amer >60 >60 mL/min    Comment: (NOTE) The eGFR has been calculated using the CKD EPI equation. This calculation has not been validated in all clinical situations. eGFR's persistently <60 mL/min signify possible Chronic Kidney Disease.    Anion gap 10 5 - 15  Protime-INR     Status: None   Collection Time: 05/25/15  1:44 PM  Result Value Ref Range   Prothrombin Time 14.8 11.6 - 15.2 seconds   INR 1.14 0.00 - 1.49  Gram stain     Status: None   Collection Time: 05/25/15  4:20 PM  Result Value Ref Range   Specimen Description TISSUE LEFT SHOULDER    Special Requests NONE    Gram Stain      MODERATE WBC PRESENT, PREDOMINANTLY MONONUCLEAR NO ORGANISMS SEEN    Report Status 05/25/2015 FINAL   Tissue culture     Status: None (Preliminary result)   Collection Time: 05/25/15  4:20 PM  Result Value Ref Range   Specimen Description TISSUE LEFT SHOULDER    Special Requests NONE    Gram Stain      MODERATE WBC PRESENT, PREDOMINANTLY MONONUCLEAR NO ORGANISMS SEEN Performed at [facility] Performed at [facility]    Culture PENDING    Report Status PENDING   Anaerobic culture     Status: None (Preliminary result)   Collection Time: 05/25/15  4:49 PM  Result Value Ref Range   Specimen Description SYNOVIAL LEFT SHOULDER    Special Requests FLUID ON SWAB    Gram Stain      FEW WBC PRESENT,BOTH PMN AND MONONUCLEAR NO ORGANISMS SEEN    Culture      NO ANAEROBES ISOLATED; CULTURE IN PROGRESS FOR 5 DAYS   Report Status PENDING   Gram stain     Status: None   Collection Time: 05/25/15  4:49 PM  Result Value Ref Range   Specimen Description SYNOVIAL LEFT SHOULDER    Special Requests FLUID ON SWAB    Gram Stain      FEW WBC PRESENT,BOTH PMN AND  MONONUCLEAR NO ORGANISMS SEEN    Report Status 05/25/2015 FINAL   Body fluid culture     Status: None (Preliminary result)   Collection Time: 05/25/15  4:49 PM  Result Value Ref Range   Specimen Description SYNOVIAL LEFT SHOULDER    Special Requests FLUID ON SWAB    Gram Stain      FEW WBC PRESENT,BOTH PMN AND MONONUCLEAR NO ORGANISMS SEEN    Culture PENDING    Report Status PENDING   Sedimentation rate     Status: None   Collection Time: 05/25/15  7:40 PM  Result Value Ref Range   Sed Rate 2 0 - 16 mm/hr  C-reactive protein     Status: None   Collection Time: 05/25/15  7:40 PM  Result Value Ref Range   CRP <0.5 <1.0 mg/dL  CBC with Differential/Platelet     Status: None   Collection Time: 05/26/15  6:32 AM  Result Value Ref Range   WBC 7.4 4.0 - 10.5 K/uL   RBC 4.59 4.22 - 5.81 MIL/uL   Hemoglobin 13.3 13.0 - 17.0 g/dL   HCT 41.8 39.0 - 52.0 %   MCV 91.1 78.0 - 100.0 fL   MCH 29.0 26.0 - 34.0 pg   MCHC 31.8 30.0 - 36.0 g/dL   RDW 15.1 11.5 - 15.5 %  Platelets 216 150 - 400 K/uL   Neutrophils Relative % 64 %   Neutro Abs 4.8 1.7 - 7.7 K/uL   Lymphocytes Relative 25 %   Lymphs Abs 1.8 0.7 - 4.0 K/uL   Monocytes Relative 8 %   Monocytes Absolute 0.6 0.1 - 1.0 K/uL   Eosinophils Relative 3 %   Eosinophils Absolute 0.2 0.0 - 0.7 K/uL   Basophils Relative 0 %   Basophils Absolute 0.0 0.0 - 0.1 K/uL      Component Value Date/Time   SDES SYNOVIAL LEFT SHOULDER 05/25/2015 1649   SDES SYNOVIAL LEFT SHOULDER 05/25/2015 1649   SDES SYNOVIAL LEFT SHOULDER 05/25/2015 1649   SPECREQUEST FLUID ON SWAB 05/25/2015 1649   SPECREQUEST FLUID ON SWAB 05/25/2015 1649   SPECREQUEST FLUID ON SWAB 05/25/2015 1649   CULT  05/25/2015 1649    NO ANAEROBES ISOLATED; CULTURE IN PROGRESS FOR 5 DAYS   CULT PENDING 05/25/2015 1649   REPTSTATUS PENDING 05/25/2015 1649   REPTSTATUS 05/25/2015 FINAL 05/25/2015 1649   REPTSTATUS PENDING 05/25/2015 1649   No results found. Recent Results  (from the past 240 hour(s))  Gram stain     Status: None   Collection Time: 05/25/15  4:20 PM  Result Value Ref Range Status   Specimen Description TISSUE LEFT SHOULDER  Final   Special Requests NONE  Final   Gram Stain   Final    MODERATE WBC PRESENT, PREDOMINANTLY MONONUCLEAR NO ORGANISMS SEEN    Report Status 05/25/2015 FINAL  Final  Tissue culture     Status: None (Preliminary result)   Collection Time: 05/25/15  4:20 PM  Result Value Ref Range Status   Specimen Description TISSUE LEFT SHOULDER  Final   Special Requests NONE  Final   Gram Stain   Final    MODERATE WBC PRESENT, PREDOMINANTLY MONONUCLEAR NO ORGANISMS SEEN Performed at [facility] Performed at [facility]    Culture PENDING  Incomplete   Report Status PENDING  Incomplete  Anaerobic culture     Status: None (Preliminary result)   Collection Time: 05/25/15  4:49 PM  Result Value Ref Range Status   Specimen Description SYNOVIAL LEFT SHOULDER  Final   Special Requests FLUID ON SWAB  Final   Gram Stain   Final    FEW WBC PRESENT,BOTH PMN AND MONONUCLEAR NO ORGANISMS SEEN    Culture   Final    NO ANAEROBES ISOLATED; CULTURE IN PROGRESS FOR 5 DAYS   Report Status PENDING  Incomplete  Gram stain     Status: None   Collection Time: 05/25/15  4:49 PM  Result Value Ref Range Status   Specimen Description SYNOVIAL LEFT SHOULDER  Final   Special Requests FLUID ON SWAB  Final   Gram Stain   Final    FEW WBC PRESENT,BOTH PMN AND MONONUCLEAR NO ORGANISMS SEEN    Report Status 05/25/2015 FINAL  Final  Body fluid culture     Status: None (Preliminary result)   Collection Time: 05/25/15  4:49 PM  Result Value Ref Range Status   Specimen Description SYNOVIAL LEFT SHOULDER  Final   Special Requests FLUID ON SWAB  Final   Gram Stain   Final    FEW WBC PRESENT,BOTH PMN AND MONONUCLEAR NO ORGANISMS SEEN    Culture PENDING  Incomplete   Report Status PENDING  Incomplete      05/26/2015, 1:38 PM      LOS: 1 day    Records and images were  personally reviewed where available.

## 2015-05-26 NOTE — Progress Notes (Signed)
Subjective: 1 Day Post-Op Procedure(s) (LRB): SHOULDER ARTHROSCOPY WITH INCISION AND DRAINAGE (Left) Patient reports pain as moderate.  Without complaints this a.m.  Objective: Vital signs in last 24 hours: Temp:  [98.4 F (36.9 C)-98.8 F (37.1 C)] 98.8 F (37.1 C) (05/09 0500) Pulse Rate:  [57-82] 68 (05/09 0500) Resp:  [10-20] 18 (05/09 0500) BP: (135-166)/(63-98) 136/70 mmHg (05/09 0500) SpO2:  [96 %-100 %] 96 % (05/09 0500) Weight:  [100.699 kg (222 lb)] 100.699 kg (222 lb) (05/08 1324)  Intake/Output from previous day: 05/08 0701 - 05/09 0700 In: 1925 [P.O.:75; I.V.:1500; IV Piggyback:350] Out: 135 [Drains:75; Blood:60] Intake/Output this shift:     Recent Labs  05/25/15 1344 05/26/15 0632  HGB 16.2 13.3    Recent Labs  05/25/15 1344 05/26/15 0632  WBC 7.5 7.4  RBC 5.77 4.59  HCT 51.2 41.8  PLT 217 216    Recent Labs  05/25/15 1344  NA 141  K 3.9  CL 104  CO2 27  BUN 19  CREATININE 0.83  GLUCOSE 107*  CALCIUM 9.5    Recent Labs  05/25/15 1344  INR 1.14  Sedimentation rate yesterday 2 mm CRP yesterday less than 0.5. Gram stain 2 yesterday showed no organisms. Patient has been off of oral antibiotics 5 days preop. Tissue, body fluid, and aerobic cultures pending. Left shoulder exam: JP drain intact. He has drained a little over 100 mL since surgery. Bloody fluid. Dressing clean and dry. No redness outside of dressing.  Assessment/Plan: 1 Day Post-Op Procedure(s) (LRB): SHOULDER ARTHROSCOPY WITH INCISION AND DRAINAGE (Left) Possible left shoulder infection versus severe foreign body reaction secondary to fiber tapes and anchors. They have now been removed. Plan: Continue IV vancomycin and Ancef. Consult infectious disease. We will await their input. Continue sling. Continue drain. Discussed surgical findings with the patient and his wife. Await culture results.   Reshma Hoey,Caid G 05/26/2015, 9:45 AM

## 2015-05-26 NOTE — Op Note (Signed)
NAME:  John Simpson, Tyrin            ACCOUNT NO.:  [id number]  MEDICAL RECORD NO.:  [id number]  LOCATION:  [id number]                        FACILITY:  [facility]  PHYSICIAN:  Harvie JuniorJohn L. Deloise Marchant, M.D.   DATE OF BIRTH:  08-May-1950  DATE OF PROCEDURE:  05/25/2015 DATE OF DISCHARGE:                              OPERATIVE REPORT   PREOPERATIVE DIAGNOSIS:  Status post rotator cuff repair with questionable superficial infection.  POSTOPERATIVE DIAGNOSIS:  Deep infection, left shoulder with failure of a rotator cuff repair and failure of suture and suture anchors.  PROCEDURE: 1. Removal of multiple deep implants including suture from the humeral     head. 2.  incision and drainage of infected bursa open 3. incision and drainage of bone cortex for concern of bone abscess and/or osteomyelitis  SURGEON:  Harvie JuniorJohn L. Tiarah Shisler, MD  ASSISTANT:  Orma FlamingBethune.  ANESTHESIA:  General.  BRIEF HISTORY:  Mr. John Simpson is a 65 year old male with a long history of complaints of left shoulder pain.  He ultimately was treated with a rotator cuff repair with multiple suture anchors and a bicipital tenodesis.  He failed while he was doing well initially and then unfortunately began developing a little red area.  We had made a small incision in the office and drained out a lot of necrotic type tissue out of the shoulder.  He improved, significantly was drained, and stopped and unfortunately we were concerned about infection and he was taken to the operating room for evaluation.  DESCRIPTION OF PROCEDURE:  The patient was taken to the operating room. After adequate anesthesia was obtained with general anesthetic, the patient was placed supine on the operating room table.  Incision was made incorporating the old draining sinus, subcutaneous tissue down the level of the deltoid, we got into the space and to the tissue above the deltoid, appeared to be bulging from below the deltoid, but there was no rent or area for this to  get through.  I made a small incision in the deltoid and encountered a dramatic amount of necrotic tissue and at this point, we felt that we needed to open the deltoid.  We opened the deltoid and gave access unfortunately to dramatic amounts of heaped up bursal and bursal tissue.  The suture anchors were free in the shoulder and with grasping a suture, the suture anchors began to come out.  At this point, we felt that this was grossly contaminated situation, removed all the suture anchors and all the suture from within that subacromial space.  Once this was done, we irrigated with 6 L of normal saline irrigation, getting a very nice irrigation of this area of the rotator cuff.  We began to grasp and the edges of it were now just flaking and fracturing and were really not able to even manipulate or mobilize the cuff at all.  At this point, the shoulder was irrigated with 6 L normal saline irrigation.  All the final check was made and removed bone from around the proximal humerus and bone from around the bicipital groove where the bicipital tenodesis had been done and got that anchor out as well.  Once this was completed, the wound was again irrigated suctioned dry.  The deltoid was closed with 1 Vicryl running, minimal.  We put a drain and to allow for deep drainage to a Blake drain.  Once this was done, the wound was irrigated.  Deltoid was closed with nylon interrupted sutures. Sterile compressive dressing applied, was taken to recovery, was noted be in satisfactory condition.  Estimated blood loss for procedure was minimal.     Harvie Junior, M.D.     Ranae Plumber  D:  05/25/2015  T:  05/26/2015  Job:  [id number]

## 2015-05-27 MED ORDER — SODIUM CHLORIDE 0.9% FLUSH
10.0000 mL | INTRAVENOUS | Status: DC | PRN
  Administered 2015-05-28: 10 mL
  Filled 2015-05-27: qty 40

## 2015-05-27 NOTE — Progress Notes (Signed)
Received call from PA to remove pt's JP drain and reinforce dressing after removal. This RN and the Charge RN attempted multiple times to remove the drain without success (met a lot of resistance and pt was in extensive pain). Called PA and left a voicemail with the update and to call back with more instruction. Drain resecured and pt currently resting in bed. Will continue to monitor and wait for further instruction.

## 2015-05-27 NOTE — Care Management Note (Addendum)
Case Management Note  Patient Details  Name: John Simpson MRN: [id number] Date of Birth: 09/06/1950  Subjective/Objective:              Admitted with septic left shoulder, s/p I and D      Action/Plan: Patient had PICC placed this am, anticipating home IV antibiotics. Spoke with patient and his wife about home IV antibiotics, wife is willing to learn to administer the IV antibiotics for the patient. Gave them choice for home health agency, they selected [facility]. Contacted Susan at [facility] and set up IV antibiotics and HHRN, order pending. Will continue to follow for discharge needs.   Expected Discharge Date:                  Expected Discharge Plan:  Home w Home Health Services  In-House Referral:  NA  Discharge planning Services  CM Consult  Post Acute Care Choice:  Durable Medical Equipment, Home Health Choice offered to:  Patient, Spouse  DME Arranged:  IV pump/equipment DME Agency:  [facility]  [facility]  [facility]  Status of Service:  In process, will continue to follow  Medicare Important Message Given:    Date Medicare IM Given:    Medicare IM give by:    Date Additional Medicare IM Given:    Additional Medicare Important Message give by:     If discussed at Long Length of Stay Meetings, dates discussed:    Additional Comments:  Monica BectonKrieg, Nitish Roes Watson, RN 05/27/2015, 11:36 AM

## 2015-05-27 NOTE — Progress Notes (Signed)
INFECTIOUS DISEASE PROGRESS NOTE  ID: John Simpson is a 65 y.o. male with  Principal Problem:   Septic arthritis of shoulder, left (HCC) Active Problems:   S/P rotator cuff repair  Subjective: Without complaints  Abtx:  Anti-infectives    Start     Dose/Rate Route Frequency Ordered Stop   05/26/15 1600  cefTRIAXone (ROCEPHIN) 2 g in dextrose 5 % 50 mL IVPB     2 g 100 mL/hr over 30 Minutes Intravenous Every 24 hours 05/26/15 1404     05/25/15 2300  ceFAZolin (ANCEF) IVPB 2g/100 mL premix  Status:  Discontinued     2 g 200 mL/hr over 30 Minutes Intravenous Every 6 hours 05/25/15 1902 05/26/15 1418   05/25/15 2200  vancomycin (VANCOCIN) IVPB 750 mg/150 ml premix     750 mg 150 mL/hr over 60 Minutes Intravenous Every 12 hours 05/25/15 2132     05/25/15 1317  ceFAZolin (ANCEF) 2-4 GM/100ML-% IVPB    Comments:  Domenica Fail   : cabinet override      05/25/15 1317 05/25/15 1657      Medications:  Scheduled: . aspirin EC  325 mg Oral Daily  . cefTRIAXone (ROCEPHIN)  IV  2 g Intravenous Q24H  . docusate sodium  100 mg Oral BID  . lisinopril  20 mg Oral Daily   And  . hydrochlorothiazide  12.5 mg Oral Daily  . vancomycin  750 mg Intravenous Q12H    Objective: Vital signs in last 24 hours: Temp:  [98 F (36.7 C)-98.6 F (37 C)] 98.3 F (36.8 C) (05/10 0447) Pulse Rate:  [65-75] 75 (05/10 0447) Resp:  [18] 18 (05/10 0447) BP: (117-152)/(62-85) 129/62 mmHg (05/10 0447) SpO2:  [96 %-97 %] 97 % (05/10 0447)   General appearance: alert, cooperative and no distress Resp: clear to auscultation bilaterally Cardio: regular rate and rhythm GI: normal findings: bowel sounds normal and soft, non-tender Extremities: L shoulder dressed. no surrounding erythema.   Lab Results  Recent Labs  05/25/15 1344 05/26/15 0632  WBC 7.5 7.4  HGB 16.2 13.3  HCT 51.2 41.8  NA 141  --   K 3.9  --   CL 104  --   CO2 27  --   BUN 19  --   CREATININE 0.83  --    Liver  Panel  Recent Labs  05/25/15 1344  PROT 6.8  ALBUMIN 4.3  AST 42*  ALT 45  ALKPHOS 53  BILITOT 0.6   Sedimentation Rate  Recent Labs  05/25/15 1940  ESRSEDRATE 2   C-Reactive Protein  Recent Labs  05/25/15 1940  CRP <0.5    Microbiology: Recent Results (from the past 240 hour(s))  Anaerobic culture     Status: None (Preliminary result)   Collection Time: 05/25/15  4:20 PM  Result Value Ref Range Status   Specimen Description TISSUE LEFT SHOULDER  Final   Special Requests NONE  Final   Gram Stain PENDING  Incomplete   Culture   Final    NO ANAEROBES ISOLATED; CULTURE IN PROGRESS FOR 5 DAYS Performed at [facility]    Report Status PENDING  Incomplete  Gram stain     Status: None   Collection Time: 05/25/15  4:20 PM  Result Value Ref Range Status   Specimen Description TISSUE LEFT SHOULDER  Final   Special Requests NONE  Final   Gram Stain   Final    MODERATE WBC PRESENT, PREDOMINANTLY MONONUCLEAR NO ORGANISMS SEEN  Report Status 05/25/2015 FINAL  Final  Tissue culture     Status: None (Preliminary result)   Collection Time: 05/25/15  4:20 PM  Result Value Ref Range Status   Specimen Description TISSUE LEFT SHOULDER  Final   Special Requests NONE  Final   Gram Stain   Final    MODERATE WBC PRESENT, PREDOMINANTLY MONONUCLEAR NO ORGANISMS SEEN [facility]    Culture   Final    NO GROWTH 1 DAY [facility]formed at [facility]    Report Status PENDING  Incomplete  Anaerobic culture     Status: None (Preliminary result)   Collection Time: 05/25/15  4:49 PM  Result Value Ref Range Status   Specimen Description SYNOVIAL LEFT SHOULDER  Final   Special Requests FLUID ON SWAB  Final   Gram Stain   Final    FEW WBC PRESENT,BOTH PMN AND MONONUCLEAR NO ORGANISMS SEEN    Culture   Final    NO ANAEROBES ISOLATED; CULTURE IN PROGRESS FOR 5 DAYS   Report Status PENDING  Incomplete  Gram stain      Status: None   Collection Time: 05/25/15  4:49 PM  Result Value Ref Range Status   Specimen Description SYNOVIAL LEFT SHOULDER  Final   Special Requests FLUID ON SWAB  Final   Gram Stain   Final    FEW WBC PRESENT,BOTH PMN AND MONONUCLEAR NO ORGANISMS SEEN    Report Status 05/25/2015 FINAL  Final  Body fluid culture     Status: None (Preliminary result)   Collection Time: 05/25/15  4:49 PM  Result Value Ref Range Status   Specimen Description SYNOVIAL LEFT SHOULDER  Final   Special Requests FLUID ON SWAB  Final   Gram Stain   Final    FEW WBC PRESENT,BOTH PMN AND MONONUCLEAR NO ORGANISMS SEEN    Culture NO GROWTH 2 DAYS  Final   Report Status PENDING  Incomplete    Studies/Results: No results found.   Assessment/Plan: L shoulder septic arthritis  Total days of antibiotics: 2 vanco/ceftriaxone  Would continue his current atbx for 28 days total unless his Cx allow us to narrow (so far no growth) Has PIC Will speak with his wife when she is available         Johny SaxJeffrey Sangeeta Youse Infectious Diseases (pager) [phone] [email or web address] 05/27/2015, 1:15 PM  LOS: 2 days

## 2015-05-27 NOTE — Progress Notes (Signed)
Peripherally Inserted Central Catheter/Midline Placement  The IV Nurse has discussed with the patient and/or persons authorized to consent for the patient, the purpose of this procedure and the potential benefits and risks involved with this procedure.  The benefits include less needle sticks, lab draws from the catheter and patient may be discharged home with the catheter.  Risks include, but not limited to, infection, bleeding, blood clot (thrombus formation), and puncture of an artery; nerve damage and irregular heat beat.  Alternatives to this procedure were also discussed.  PICC/Midline Placement Documentation  PICC Single Lumen 05/27/15 PICC Right Basilic 45 cm 1 cm (Active)  Indication for Insertion or Continuance of Line Home intravenous therapies (PICC only) 05/27/2015 11:00 AM  Exposed Catheter (cm) 1 cm 05/27/2015 11:00 AM  Dressing Change Due 06/03/15 05/27/2015 11:00 AM       Stacie GlazeJoyce, Taggart Prasad Horton 05/27/2015, 11:03 AM

## 2015-05-27 NOTE — Progress Notes (Signed)
Spoke with Fayrene FearingJames, [place] about JP still in place.  Fayrene FearingJames stated it will be removed in the morning by MD or himself.  No questions or concerns.  Pt resting comfortably.  Barrie LymeVance, Shalane Florendo E RN 9:08 PM 05/27/2015

## 2015-05-27 NOTE — Progress Notes (Signed)
Subjective: 2 Days Post-Op Procedure(s) (LRB): SHOULDER ARTHROSCOPY WITH INCISION AND DRAINAGE (Left) Patient reports pain as mild.  He reports his shoulder pain is decreased compared with preop. Denies fever, chills, or sweats.  Objective: Vital signs in last 24 hours: Temp:  [98 F (36.7 C)-98.6 F (37 C)] 98.3 F (36.8 C) (05/10 0447) Pulse Rate:  [65-75] 75 (05/10 0447) Resp:  [18] 18 (05/10 0447) BP: (117-152)/(62-85) 129/62 mmHg (05/10 0447) SpO2:  [96 %-97 %] 97 % (05/10 0447)  Intake/Output from previous day: 05/09 0701 - 05/10 0700 In: 998 [P.O.:555; I.V.:143; IV Piggyback:300] Out: 110 [Drains:110] Intake/Output this shift: Total I/O In: 480 [P.O.:480] Out: -    Recent Labs  05/25/15 1344 05/26/15 0632  HGB 16.2 13.3    Recent Labs  05/25/15 1344 05/26/15 0632  WBC 7.5 7.4  RBC 5.77 4.59  HCT 51.2 41.8  PLT 217 216    Recent Labs  05/25/15 1344  NA 141  K 3.9  CL 104  CO2 27  BUN 19  CREATININE 0.83  GLUCOSE 107*  CALCIUM 9.5    Recent Labs  05/25/15 1344  INR 1.14   Tissue cultures anaerobic cultures and aerobic cultures are still pending. No growth so far.  Left shoulder exam: JP drain intact. He had 20 mL of drainage last shift. Shoulder wound benign. Nylon sutures intact. Minimal redness. No active drainage. Less pain with range of motion of the shoulder.   Assessment/Plan: 2 Days Post-Op Procedure(s) (LRB): SHOULDER ARTHROSCOPY WITH INCISION AND DRAINAGE (Left) Plan: Await maturity of cultures. Dressing changed today. I will leave the JP drain in until later today. Appreciate Dr. Moshe CiproHatcher's assistance. We will heed his recommendations. Continue IV vanc and ceftriaxone. Possible discharge later today, but probably in the a.m. John Simpson,John Simpson 05/27/2015, 8:27 AM

## 2015-05-28 DIAGNOSIS — I10 Essential (primary) hypertension: Secondary | ICD-10-CM

## 2015-05-28 LAB — BASIC METABOLIC PANEL
ANION GAP: 12 (ref 5–15)
BUN: 11 mg/dL (ref 6–20)
CHLORIDE: 99 mmol/L — AB (ref 101–111)
CO2: 29 mmol/L (ref 22–32)
Calcium: 9.2 mg/dL (ref 8.9–10.3)
Creatinine, Ser: 0.88 mg/dL (ref 0.61–1.24)
GFR calc Af Amer: >60 mL/min (ref >60)
GFR calc non Af Amer: >60 mL/min (ref >60)
Glucose, Bld: 139 mg/dL — ABNORMAL HIGH (ref 65–99)
POTASSIUM: 3.6 mmol/L (ref 3.5–5.1)
SODIUM: 140 mmol/L (ref 135–145)

## 2015-05-28 LAB — VANCOMYCIN, TROUGH: VANCOMYCIN TR: 7 ug/mL — AB (ref 10.0–20.0)

## 2015-05-28 MED ORDER — VANCOMYCIN HCL 10 G IV SOLR: 1500.0000 mg | Freq: Two times a day (BID) | INTRAVENOUS | Status: AC

## 2015-05-28 MED ORDER — VANCOMYCIN HCL IN DEXTROSE 750-5 MG/150ML-% IV SOLN: 1500.0000 mg | Freq: Two times a day (BID) | INTRAVENOUS | Status: DC

## 2015-05-28 MED ORDER — DEXTROSE 5 % IV SOLN: 2.0000 g | INTRAVENOUS | Status: AC

## 2015-05-28 MED ORDER — SODIUM CHLORIDE 0.9 % IV SOLN
1500.0000 mg | Freq: Two times a day (BID) | INTRAVENOUS | Status: DC
  Administered 2015-05-28: 1500 mg via INTRAVENOUS
  Filled 2015-05-28 (×2): qty 1500

## 2015-05-28 MED ORDER — OXYCODONE-ACETAMINOPHEN 5-325 MG PO TABS: 1.0000 | ORAL_TABLET | Freq: Four times a day (QID) | ORAL | Status: DC | PRN

## 2015-05-28 MED ORDER — VANCOMYCIN HCL IN DEXTROSE 750-5 MG/150ML-% IV SOLN: 750.0000 mg | Freq: Two times a day (BID) | INTRAVENOUS | Status: DC

## 2015-05-28 MED ORDER — HEPARIN SOD (PORK) LOCK FLUSH 100 UNIT/ML IV SOLN
250.0000 [IU] | INTRAVENOUS | Status: AC | PRN
  Administered 2015-05-28: 250 [IU]

## 2015-05-28 NOTE — Progress Notes (Signed)
Patient received new dose of Vancomycin 1500 mg and last dose of Ceftriaxone prior to discharge.  IV team called and patient was capped for discharge.  Patient received discharge instructions and prescription for pain medication.  [facility] has the prescription for Vancomycin.  No questions at time of discharge.  Escorted by NT, wife to take home.

## 2015-05-28 NOTE — Progress Notes (Signed)
INFECTIOUS DISEASE PROGRESS NOTE  ID: John Simpson is a 65 y.o. male with  Principal Problem:   Septic arthritis of shoulder, left (HCC) Active Problems:   S/P rotator cuff repair  Subjective: Without complaints  Abtx:  Anti-infectives    Start     Dose/Rate Route Frequency Ordered Stop   05/28/15 1200  vancomycin (VANCOCIN) 1,500 mg in sodium chloride 0.9 % 500 mL IVPB     1,500 mg 250 mL/hr over 120 Minutes Intravenous Every 12 hours 05/28/15 1158     05/28/15 0000  cefTRIAXone 2 g in dextrose 5 % 50 mL     2 g 100 mL/hr over 30 Minutes Intravenous Every 24 hours 05/28/15 0828     05/28/15 0000  Vancomycin (VANCOCIN) 750-5 MG/150ML-% SOLN  Status:  Discontinued    Comments:  46 units   Treat x 26 days.   750 mg 150 mL/hr over 60 Minutes Intravenous Every 12 hours 05/28/15 0828 05/28/15    05/28/15 0000  Vancomycin (VANCOCIN) 750-5 MG/150ML-% SOLN    Comments:  46 units   Treat x 26 days.   1,500 mg 300 mL/hr over 60 Minutes Intravenous Every 12 hours 05/28/15 1232     05/28/15 0000  vancomycin 1,500 mg in sodium chloride 0.9 % 500 mL     1,500 mg 250 mL/hr over 120 Minutes Intravenous Every 12 hours 05/28/15 1240     05/26/15 1600  cefTRIAXone (ROCEPHIN) 2 g in dextrose 5 % 50 mL IVPB     2 g 100 mL/hr over 30 Minutes Intravenous Every 24 hours 05/26/15 1404     05/25/15 2300  ceFAZolin (ANCEF) IVPB 2g/100 mL premix  Status:  Discontinued     2 g 200 mL/hr over 30 Minutes Intravenous Every 6 hours 05/25/15 1902 05/26/15 1418   05/25/15 2200  vancomycin (VANCOCIN) IVPB 750 mg/150 ml premix  Status:  Discontinued     750 mg 150 mL/hr over 60 Minutes Intravenous Every 12 hours 05/25/15 2132 05/28/15 1158   05/25/15 1317  ceFAZolin (ANCEF) 2-4 GM/100ML-% IVPB    Comments:  Ronnald Ramp, [place]   : cabinet override      05/25/15 1317 05/25/15 1657      Medications:  Scheduled: . aspirin EC  325 mg Oral Daily  . cefTRIAXone (ROCEPHIN)  IV  2 g Intravenous Q24H  .  docusate sodium  100 mg Oral BID  . lisinopril  20 mg Oral Daily   And  . hydrochlorothiazide  12.5 mg Oral Daily  . vancomycin  1,500 mg Intravenous Q12H    Objective: Vital signs in last 24 hours: Temp:  [98.7 F (37.1 C)-98.9 F (37.2 C)] 98.7 F (37.1 C) (05/11 0515) Pulse Rate:  [56-70] 56 (05/11 1038) Resp:  [18] 18 (05/11 0515) BP: (157-170)/(70-77) 157/70 mmHg (05/11 1038) SpO2:  [96 %-97 %] 97 % (05/11 0515)   General appearance: alert, cooperative and no distress Chest wall: no tenderness, dressing on L shoulder  Lab Results  Recent Labs  05/25/15 1344 05/26/15 0632 05/28/15 0330  WBC 7.5 7.4  --   HGB 16.2 13.3  --   HCT 51.2 41.8  --   NA 141  --  140  K 3.9  --  3.6  CL 104  --  99*  CO2 27  --  29  BUN 19  --  11  CREATININE 0.83  --  0.88   Liver Panel  Recent Labs  05/25/15 1344  PROT  6.8  ALBUMIN 4.3  AST 42*  ALT 45  ALKPHOS 53  BILITOT 0.6   Sedimentation Rate  Recent Labs  05/25/15 1940  ESRSEDRATE 2   C-Reactive Protein  Recent Labs  05/25/15 1940  CRP <0.5    Microbiology: Recent Results (from the past 240 hour(s))  Anaerobic culture     Status: None (Preliminary result)   Collection Time: 05/25/15  4:20 PM  Result Value Ref Range Status   Specimen Description TISSUE LEFT SHOULDER  Final   Special Requests NONE  Final   Gram Stain PENDING  Incomplete   Culture   Final    NO ANAEROBES ISOLATED; CULTURE IN PROGRESS FOR 5 DAYS Performed at [facility]    Report Status PENDING  Incomplete  Gram stain     Status: None   Collection Time: 05/25/15  4:20 PM  Result Value Ref Range Status   Specimen Description TISSUE LEFT SHOULDER  Final   Special Requests NONE  Final   Gram Stain   Final    MODERATE WBC PRESENT, PREDOMINANTLY MONONUCLEAR NO ORGANISMS SEEN    Report Status 05/25/2015 FINAL  Final  Tissue culture     Status: None (Preliminary result)   Collection Time: 05/25/15  4:20 PM  Result Value Ref  Range Status   Specimen Description TISSUE LEFT SHOULDER  Final   Special Requests NONE  Final   Gram Stain   Final    MODERATE WBC PRESENT, PREDOMINANTLY MONONUCLEAR NO ORGANISMS SEEN Performed at [facility] Performed at [facility]    Culture   Final    NO GROWTH 2 DAYS Performed at [facility]    Report Status PENDING  Incomplete  Anaerobic culture     Status: None (Preliminary result)   Collection Time: 05/25/15  4:49 PM  Result Value Ref Range Status   Specimen Description SYNOVIAL LEFT SHOULDER  Final   Special Requests FLUID ON SWAB  Final   Gram Stain   Final    FEW WBC PRESENT,BOTH PMN AND MONONUCLEAR NO ORGANISMS SEEN    Culture   Final    NO ANAEROBES ISOLATED; CULTURE IN PROGRESS FOR 5 DAYS   Report Status PENDING  Incomplete  Gram stain     Status: None   Collection Time: 05/25/15  4:49 PM  Result Value Ref Range Status   Specimen Description SYNOVIAL LEFT SHOULDER  Final   Special Requests FLUID ON SWAB  Final   Gram Stain   Final    FEW WBC PRESENT,BOTH PMN AND MONONUCLEAR NO ORGANISMS SEEN    Report Status 05/25/2015 FINAL  Final  Body fluid culture     Status: None (Preliminary result)   Collection Time: 05/25/15  4:49 PM  Result Value Ref Range Status   Specimen Description SYNOVIAL LEFT SHOULDER  Final   Special Requests FLUID ON SWAB  Final   Gram Stain   Final    FEW WBC PRESENT,BOTH PMN AND MONONUCLEAR NO ORGANISMS SEEN    Culture NO GROWTH 3 DAYS  Final   Report Status PENDING  Incomplete    Studies/Results: No results found.   Assessment/Plan:  L shoulder septic arthritis HTN  Total days of antibiotics: 3/28 vanco/ceftriaxone  No change in atbx His Cx are ngtd Will plan on broad therapy  can f/u in ID clinic in 3 weeks.  Primary to address HTN  Diagnosis: L septic arthritis  Culture Result: no growth  No Known Allergies  Discharge antibiotics:  ceftraixone 2 g q24h Vancomycin Per pharmacy  protocol  Aim for Vancomycin trough 15-20  Duration: 28 total days  End Date: June 22, 2015  [facility] Per Protocol:  Labs weekly while on IV antibiotics: _x_ CBC with differential _x_ CMP _x_ CRP _x_ ESR _x_ Vancomycin trough  Fax weekly labs to [phone]  Clinic Follow Up Appt: 3 weeks         Bobby Rumpf Infectious Diseases (pager) [phone] [email or web address] 05/28/2015, 1:26 PM  LOS: 3 days     \

## 2015-05-28 NOTE — Progress Notes (Signed)
Subjective: 3 Days Post-Op Procedure(s) (LRB): SHOULDER ARTHROSCOPY WITH INCISION AND DRAINAGE (Left) Patient reports pain as moderate.  He has seen improvement in shoulder pain postoperatively.  Taking by mouth and voiding okay.  Objective: Vital signs in last 24 hours: Temp:  [98.3 F (36.8 C)-98.9 F (37.2 C)] 98.7 F (37.1 C) (05/11 0515) Pulse Rate:  [63-70] 70 (05/11 0515) Resp:  [18] 18 (05/11 0515) BP: (164-170)/(62-77) 164/73 mmHg (05/11 0515) SpO2:  [96 %-97 %] 97 % (05/11 0515)  Intake/Output from previous day: 05/10 0701 - 05/11 0700 In: 1640 [P.O.:1440; IV Piggyback:200] Out: 335 [Urine:300; Drains:35] Intake/Output this shift: Total I/O In: 240 [P.O.:240] Out: -    Recent Labs  05/25/15 1344 05/26/15 0632  HGB 16.2 13.3    Recent Labs  05/25/15 1344 05/26/15 0632  WBC 7.5 7.4  RBC 5.77 4.59  HCT 51.2 41.8  PLT 217 216    Recent Labs  05/25/15 1344 05/28/15 0330  NA 141 140  K 3.9 3.6  CL 104 99*  CO2 27 29  BUN 19 11  CREATININE 0.83 0.88  GLUCOSE 107* 139*  CALCIUM 9.5 9.2    Recent Labs  05/25/15 1344  INR 1.14  left shoulder exam: JP drain intact.  He has about 20 cc of fluid in the drain.  He is passively moving his left shoulder with minimal pain.  Dressing clean and dry today.  Neurovascular status is intact to the left upper extremity. Tissue/fluid cultures from the left shoulder have shown no growth.  Assessment/Plan: 3 Days Post-Op Procedure(s) (LRB): SHOULDER ARTHROSCOPY WITH INCISION AND DRAINAGE (Left) Plan: JP drain pulled. PIC line is in place. Okay to discharge home after seen by Dr. Ninetta LightsHatcher today. We appreciate his help. We will plan on 4 weeks of therapy with IV vancomycin and ceftriaxone. He may do passive range of motion to his left shoulder. Followup with Dr. Luiz BlareGraves in 10-14 days.  Skyelynn Rambeau,Ronon G 05/28/2015, 9:36 AM

## 2015-05-28 NOTE — Progress Notes (Addendum)
Addendum: Discussed increased dose and need for new prescription with Marshia LyJames Bethune, PA. He will adjust discharge orders and get a new prescription for the patient.  Link SnufferJessica Cove Haydon, PharmD, BCPS Clinical Pharmacist [phone] 05/28/2015, 12:12 PM   Pharmacy Antibiotic Note  Tillman AbideJames L Simpson is a 65 y.o. male admitted on 05/25/2015 with L-shoulder septic arthritis.  Pharmacy has been consulted for vancomycin dosing.  Vancomycin trough is low at 7 today on 750mg  IV every 12 hours - drawn correctly and on time. Patient is afebrile and plans in place for 4 weeks of antibiotics  Plan: Increase vancomycin to 1500 mg IV every 12 hours for goal trough of 15 to 20 with septic arthritis Recommend repeat vancomycin trough in 1 week to confirm and prevent accumulation.   Height: 5\' 11"  (180.3 cm) Weight: 222 lb (100.699 kg) IBW/kg (Calculated) : 75.3  Temp (24hrs), Avg:98.6 F (37 C), Min:98.3 F (36.8 C), Max:98.9 F (37.2 C)   Recent Labs Lab 05/25/15 1344 05/26/15 0632 05/28/15 0330 05/28/15 1055  WBC 7.5 7.4  --   --   CREATININE 0.83  --  0.88  --   VANCOTROUGH  --   --   --  7*    Estimated Creatinine Clearance: 105.3 mL/min (by C-G formula based on Cr of 0.88).    No Known Allergies  Antimicrobials this admission: Vancomycin 5/8 >> Rocephin 5/9 >>  Dose adjustments this admission: VT 7 on 750mg  IV every 12 hours >> increased to 1500mg  IV every 12 hours.   Microbiology results: 5/8 Tissue >> ngtd 5/8 Synovial fluid cx >> ngtd   Thank you for allowing pharmacy to be a part of this patient's care.  Link SnufferJessica Rodger Giangregorio, PharmD, BCPS Clinical Pharmacist [phone]  05/28/2015 11:48 AM

## 2015-05-29 LAB — TISSUE CULTURE: CULTURE: NO GROWTH

## 2015-05-29 LAB — BODY FLUID CULTURE: Culture: NO GROWTH

## 2015-05-29 NOTE — Discharge Summary (Signed)
Patient ID: John Simpson Ticer MRN: [id number] DOB/AGE: 63/05/1950 63 y.o.  Admit date: 05/25/2015 Discharge date: 05/28/2015 Admission Diagnoses:  Principal Problem:   Septic arthritis of shoulder, left (HCC) Active Problems:   S/P rotator cuff repair Hypertension  Discharge Diagnoses:  Same  Past Medical History  Diagnosis Date  . Hypertension     takes Lisinopril-HCTZ daily   . Pneumonia     hx of-as a baby  . Joint pain   . Migraine     takes Imitrex daily as needed  . Chronic bronchitis (HCC)     "get it pretty much q yr" (05/25/2015)  . Arthritis     "most qwhere" (05/25/2015)  . Anxiety     takes Xanax as needed (05/25/2015)    Surgeries: Procedure(s):Left  SHOULDER ARTHROSCOPY WITH INCISION AND DRAINAGE on 05/25/2015 with removal of multiple foreign bodies/fiber tapes/anchors. Removal of multiple deep implants and I& D of bone cortex left shoulder.   Consultants:  Dr. Ninetta LightsHatcher   infectious disease  Discharged Condition: Improved  Hospital Course: John Simpson Montee is an 65 y.o. male who was admitted 05/25/2015 for operative treatment ofSeptic arthritis of shoulder, left (HCC). Patient has severe unremitting pain that affects sleep, daily activities, and work/hobbies. After pre-op clearance the patient was taken to the operating room on 05/25/2015 and underwent  Procedure(s): Left SHOULDER ARTHROSCOPY WITH INCISION AND DRAINAGE. The patient is approximately 6 weeks status post left shoulder rotator cuff repair with use of multiple peek anchors and fiber tapes. He developed drainage from his left shoulder incision. this was cultured in the office which was negative. The drainage persisted and the decision was made that he intentionally had a left shoulder septic arthritis. He was on oral antibiotics on an outpatient basis prior to this admission. Prior to the admission/surgery infectious disease physicians were contacted and they suggested coming off of oral antibiotics 5 days so we  could obtain more accurate cultures. The patient did not improve with exhaustive conservative management  Patient was given perioperative antibiotics:      Anti-infectives    Start     Dose/Rate Route Frequency Ordered Stop   05/28/15 1200  vancomycin (VANCOCIN) 1,500 mg in sodium chloride 0.9 % 500 mL IVPB  Status:  Discontinued     1,500 mg 250 mL/hr over 120 Minutes Intravenous Every 12 hours 05/28/15 1158 05/28/15 2115   05/28/15 0000  cefTRIAXone 2 g in dextrose 5 % 50 mL     2 g 100 mL/hr over 30 Minutes Intravenous Every 24 hours 05/28/15 0828     05/28/15 0000  Vancomycin (VANCOCIN) 750-5 MG/150ML-% SOLN  Status:  Discontinued    Comments:  46 units   Treat x 26 days.   750 mg 150 mL/hr over 60 Minutes Intravenous Every 12 hours 05/28/15 0828 05/28/15    05/28/15 0000  Vancomycin (VANCOCIN) 750-5 MG/150ML-% SOLN  Status:  Discontinued    Comments:  46 units   Treat x 26 days.   1,500 mg 300 mL/hr over 60 Minutes Intravenous Every 12 hours 05/28/15 1232 05/29/15    05/28/15 0000  vancomycin 1,500 mg in sodium chloride 0.9 % 500 mL     1,500 mg 250 mL/hr over 120 Minutes Intravenous Every 12 hours 05/28/15 1240     05/26/15 1600  cefTRIAXone (ROCEPHIN) 2 g in dextrose 5 % 50 mL IVPB  Status:  Discontinued     2 g 100 mL/hr over 30 Minutes Intravenous Every 24 hours 05/26/15 1404 05/28/15  2115   05/25/15 2300  ceFAZolin (ANCEF) IVPB 2g/100 mL premix  Status:  Discontinued     2 g 200 mL/hr over 30 Minutes Intravenous Every 6 hours 05/25/15 1902 05/26/15 1418   05/25/15 2200  vancomycin (VANCOCIN) IVPB 750 mg/150 ml premix  Status:  Discontinued     750 mg 150 mL/hr over 60 Minutes Intravenous Every 12 hours 05/25/15 2132 05/28/15 1158   05/25/15 1317  ceFAZolin (ANCEF) 2-4 GM/100ML-% IVPB    Comments:  Domenica Fail   : cabinet override      05/25/15 1317 05/25/15 1657      Microbiology: Recent Results (from the past 240 hour(s))  Anaerobic culture Status: None  (Preliminary result)   Collection Time: 05/25/15 4:20 PM  Result Value Ref Range Status   Specimen Description TISSUE LEFT SHOULDER  Final   Special Requests NONE  Final   Gram Stain PENDING  Incomplete   Culture   Final    NO ANAEROBES ISOLATED; CULTURE IN PROGRESS FOR 5 DAYS Performed at [facility]    Report Status PENDING  Incomplete  Gram stain Status: None   Collection Time: 05/25/15 4:20 PM  Result Value Ref Range Status   Specimen Description TISSUE LEFT SHOULDER  Final   Special Requests NONE  Final   Gram Stain   Final    MODERATE WBC PRESENT, PREDOMINANTLY MONONUCLEAR NO ORGANISMS SEEN    Report Status 05/25/2015 FINAL  Final  Tissue culture Status: None (Preliminary result)   Collection Time: 05/25/15 4:20 PM  Result Value Ref Range Status   Specimen Description TISSUE LEFT SHOULDER  Final   Special Requests NONE  Final   Gram Stain   Final    MODERATE WBC PRESENT, PREDOMINANTLY MONONUCLEAR NO ORGANISMS SEEN Performed at [facility] Performed at [facility]    Culture   Final    NO GROWTH 2 DAYS Performed at [facility]    Report Status PENDING  Incomplete  Anaerobic culture Status: None (Preliminary result)   Collection Time: 05/25/15 4:49 PM  Result Value Ref Range Status   Specimen Description SYNOVIAL LEFT SHOULDER  Final   Special Requests FLUID ON SWAB  Final   Gram Stain   Final    FEW WBC PRESENT,BOTH PMN AND MONONUCLEAR NO ORGANISMS SEEN    Culture   Final    NO ANAEROBES ISOLATED; CULTURE IN PROGRESS FOR 5 DAYS   Report Status PENDING  Incomplete  Gram stain Status: None   Collection Time: 05/25/15 4:49 PM  Result Value Ref Range Status   Specimen Description SYNOVIAL LEFT SHOULDER  Final   Special Requests FLUID ON SWAB  Final    Gram Stain   Final    FEW WBC PRESENT,BOTH PMN AND MONONUCLEAR NO ORGANISMS SEEN    Report Status 05/25/2015 FINAL  Final  Body fluid culture Status: None (Preliminary result)   Collection Time: 05/25/15 4:49 PM  Result Value Ref Range Status   Specimen Description SYNOVIAL LEFT SHOULDER  Final   Special Requests FLUID ON SWAB  Final   Gram Stain   Final    FEW WBC PRESENT,BOTH PMN AND MONONUCLEAR NO ORGANISMS SEEN    Culture NO GROWTH 3 DAYS  Final   Report Status PENDING        CRP on day of admission 0.5 Sedimentation rate on day of admission 2 mm. WBC on day of admission 7.4. Hemoglobin 13.3 hematocrit 41.0. Patient was given sequential compression devices, early  ambulation, and chemoprophylaxis to prevent DVT.Infectious disease consult was obtained on the date of surgery. He was started on IV vancomycin and IV Ancef initially. Infectious disease then recommended IV ceftriaxone as well as IV vancomycin. Cultures were negative to date during this hospitalization. The Jackson-Pratt drain which was placed at the time of surgery was removed on postoperative day #3. Drainage at that point had reduced itself significantly. The patient was afebrile and his vital signs are stable at the time of discharge. A PICC line was placed prior to discharge. We appreciated the recommendations of Dr. Ninetta Lights for 4 weeks of IV vancomycin/ceftriaxone. Patient benefited maximally from hospital stay and there were no complications.    Recent vital signs:  Patient Vitals for the past 24 hrs:  BP Pulse Resp SpO2  05/28/15 1706 (!) 188/77 mmHg 72 16 97 %     Recent laboratory studies:   Recent Labs  05/28/15 0330  NA 140  K 3.6  CL 99*  CO2 29  BUN 11  CREATININE 0.88  GLUCOSE 139*  CALCIUM 9.2     Discharge Medications:     Medication List    STOP taking these medications        HYDROcodone-acetaminophen 10-325 MG tablet  Commonly known  as:  NORCO      TAKE these medications        ALPRAZolam 0.5 MG tablet  Commonly known as:  XANAX  Take 0.5 mg by mouth 3 (three) times daily as needed for anxiety or sleep.     aspirin 81 MG EC tablet  Take 81 mg by mouth daily.     cefTRIAXone 2 g in dextrose 5 % 50 mL  Inject 2 g into the vein daily.     diclofenac 75 MG EC tablet  Commonly known as:  VOLTAREN  Take 75 mg by mouth 2 (two) times daily.     fish oil-omega-3 fatty acids 1000 MG capsule  Take 2 g by mouth daily.     fluticasone 50 MCG/ACT nasal spray  Commonly known as:  FLONASE  Place 1 spray into both nostrils daily as needed for allergies.     lisinopril-hydrochlorothiazide 20-12.5 MG tablet  Commonly known as:  PRINZIDE,ZESTORETIC  Take 1 tablet by mouth daily.     methocarbamol 500 MG tablet  Commonly known as:  ROBAXIN  Take 500 mg by mouth daily as needed for muscle spasms.     multivitamin with minerals tablet  Take 1 tablet by mouth daily.     oxyCODONE 15 MG immediate release tablet  Commonly known as:  ROXICODONE  Take 1 tablet (15 mg total) by mouth every 6 (six) hours as needed for pain.     SUMAtriptan 100 MG tablet  Commonly known as:  IMITREX  1 tablet.     testosterone cypionate 200 MG/ML injection  Commonly known as:  DEPOTESTOSTERONE CYPIONATE  Inject 200 mg into the muscle every 14 (fourteen) days.     vancomycin 1,500 mg in sodium chloride 0.9 % 500 mL  Inject 1,500 mg into the vein every 12 (twelve) hours.     VENTOLIN HFA 108 (90 Base) MCG/ACT inhaler  Generic drug:  albuterol  Inhale 2 puffs into the lungs every 4 (four) hours as needed for wheezing or shortness of breath.     vitamin C 500 MG tablet  Commonly known as:  ASCORBIC ACID  Take 1 tablet (500 mg total) by mouth daily.     Vitamin D 2000  units tablet  Take 2,000 Units by mouth daily.     zolpidem 10 MG tablet  Commonly known as:  AMBIEN  Take 10 mg by mouth at bedtime as needed for sleep.         Diagnostic Studies: None  Disposition: 01-Home or Self Care    Follow-up Information    Follow up with GRAVES,JOHN L, MD. Schedule an appointment as soon as possible for a visit in 2 weeks.   Specialty:  Orthopedic Surgery   Contact information:   [address] [place] [address] [phone]       Follow up with [facility].   Why:  They will contact you to schedule home RN visits.   Contact information:   [address] [place] [address] [phone]     The patient will follow up at the infectious disease clinic in 3 weeks per Dr. Ninetta Lights.   SignedMatthew Folks 05/29/2015, 1:29 PM

## 2015-05-30 LAB — ANAEROBIC CULTURE

## 2015-05-31 LAB — ANAEROBIC CULTURE

## 2015-06-09 ENCOUNTER — Encounter: Payer: Self-pay | Admitting: Infectious Diseases

## 2015-06-19 ENCOUNTER — Encounter: Payer: Self-pay | Admitting: Infectious Diseases

## 2015-06-23 ENCOUNTER — Encounter: Payer: Self-pay | Admitting: Infectious Diseases

## 2015-06-23 NOTE — Telephone Encounter (Signed)
Per Dr. Ninetta LightsHatcher, the PICC "can come out Tuesday if that is most convenient for him."  Phone call to [facility].  Given order to remove PICC.

## 2015-06-24 ENCOUNTER — Ambulatory Visit (INDEPENDENT_AMBULATORY_CARE_PROVIDER_SITE_OTHER): Payer: Federal, State, Local not specified - PPO | Admitting: Infectious Diseases

## 2015-06-24 VITALS — BP 134/88 | HR 70 | Temp 98.4°F | Wt 227.0 lb

## 2015-06-24 DIAGNOSIS — M009 Pyogenic arthritis, unspecified: Secondary | ICD-10-CM | POA: Diagnosis not present

## 2015-06-24 NOTE — Progress Notes (Signed)
   Subjective:    Patient ID: John Simpson, male    DOB: Feb 18, 1950, 65 y.o.   MRN: [id number]  HPI 65 yo M with hx of HTN, prev rotator cuff repair and biceps tenodesis (04-03-15). He returned to MD office with worsening swelling. He had a shoulder aspirate done on 4-24 which was negative. He was given a course of keflex tid for 7 days which did not improve his sx.  He returned with d/c from his wound and was brought to OR on 5-8 and had I & D (all foreign material removed per pt).  .  His Cx was negative.  He completed his vanco and ceftriaxone 6-6. He still has some pain  Has had no f/c. Wound has healed well.  Has limited abduction and strength. He has not started PT yet.    Review of Systems  Constitutional: Negative for fever and chills.  HENT: Negative for tinnitus.   Gastrointestinal: Negative for diarrhea and constipation.  Genitourinary: Negative for difficulty urinating.  Musculoskeletal: Negative for arthralgias.     5-8 5-22 5-29 ESR  '2 1 1 '$ CRP  <0.5 < 0.5 < 0.5    Objective:   Physical Exam  Constitutional: He appears well-developed and well-nourished.  HENT:  Mouth/Throat: No oropharyngeal exudate.  Eyes: EOM are normal. Pupils are equal, round, and reactive to light.  Neck: Neck supple.  Cardiovascular: Normal rate, regular rhythm and normal heart sounds.   Pulmonary/Chest: Effort normal and breath sounds normal.  Abdominal: Soft. Bowel sounds are normal. There is no tenderness. There is no rebound.  Musculoskeletal:       Arms: Lymphadenopathy:    He has no cervical adenopathy.          Assessment & Plan:

## 2015-06-24 NOTE — Assessment & Plan Note (Signed)
He is doing well.  I have some concern over his warmth and possible effusion. Will defer to Dr Luiz Blaregraves re: possible repeat aspirate. At this point, his joint is not acute.  He has questions about P acnes, I showed him resources that he was on 2 atbx that would treat this.  He will f/u in 6 weeks for repeat eval.

## 2015-06-25 ENCOUNTER — Telehealth: Payer: Self-pay

## 2015-06-25 NOTE — Telephone Encounter (Signed)
Faxed Dr. Moshe CiproHatcher's office notes to Dr. Eartha InchMichael C. Badger office per Dr. Moshe CiproHatcher's request. OK status returned from fax.

## 2015-07-09 ENCOUNTER — Encounter: Payer: Self-pay | Admitting: Infectious Diseases

## 2015-08-23 ENCOUNTER — Encounter: Payer: Self-pay | Admitting: Infectious Diseases

## 2015-08-24 ENCOUNTER — Ambulatory Visit: Payer: Federal, State, Local not specified - PPO | Admitting: Infectious Diseases

## 2018-10-24 ENCOUNTER — Other Ambulatory Visit: Payer: Self-pay

## 2018-10-24 DIAGNOSIS — Z20822 Contact with and (suspected) exposure to covid-19: Secondary | ICD-10-CM

## 2018-10-25 LAB — NOVEL CORONAVIRUS, NAA: SARS-CoV-2, NAA: NOT DETECTED

## 2021-03-05 ENCOUNTER — Emergency Department (HOSPITAL_COMMUNITY)
Admission: EM | Admit: 2021-03-05 | Discharge: 2021-03-05 | Disposition: A | Discharge status: Home / Self Care | Payer: Medicare Other | Attending: Emergency Medicine | Admitting: Emergency Medicine

## 2021-03-05 ENCOUNTER — Emergency Department (HOSPITAL_COMMUNITY): Payer: Medicare Other

## 2021-03-05 DIAGNOSIS — W548XXA Other contact with dog, initial encounter: Secondary | ICD-10-CM | POA: Diagnosis not present

## 2021-03-05 DIAGNOSIS — R4182 Altered mental status, unspecified: Secondary | ICD-10-CM | POA: Diagnosis not present

## 2021-03-05 DIAGNOSIS — F10129 Alcohol abuse with intoxication, unspecified: Secondary | ICD-10-CM | POA: Diagnosis not present

## 2021-03-05 DIAGNOSIS — S0081XA Abrasion of other part of head, initial encounter: Secondary | ICD-10-CM | POA: Diagnosis not present

## 2021-03-05 DIAGNOSIS — Y908 Blood alcohol level of 240 mg/100 ml or more: Secondary | ICD-10-CM | POA: Insufficient documentation

## 2021-03-05 DIAGNOSIS — F10920 Alcohol use, unspecified with intoxication, uncomplicated: Secondary | ICD-10-CM

## 2021-03-05 DIAGNOSIS — S0993XA Unspecified injury of face, initial encounter: Secondary | ICD-10-CM | POA: Diagnosis present

## 2021-03-05 LAB — COMPREHENSIVE METABOLIC PANEL
ALT: 109 U/L — ABNORMAL HIGH (ref 0–44)
AST: 128 U/L — ABNORMAL HIGH (ref 15–41)
Albumin: 4.1 g/dL (ref 3.5–5.0)
Alkaline Phosphatase: 60 U/L (ref 38–126)
Anion gap: 13 (ref 5–15)
BUN: 10 mg/dL (ref 8–23)
CO2: 24 mmol/L (ref 22–32)
Calcium: 8.5 mg/dL — ABNORMAL LOW (ref 8.9–10.3)
Chloride: 95 mmol/L — ABNORMAL LOW (ref 98–111)
Creatinine, Ser: 0.76 mg/dL (ref 0.61–1.24)
GFR, Estimated: >60 mL/min (ref >60)
Glucose, Bld: 253 mg/dL — ABNORMAL HIGH (ref 70–99)
Potassium: 3.5 mmol/L (ref 3.5–5.1)
Sodium: 132 mmol/L — ABNORMAL LOW (ref 135–145)
Total Bilirubin: <0.1 mg/dL — ABNORMAL LOW (ref 0.3–1.2)
Total Protein: 6.5 g/dL (ref 6.5–8.1)

## 2021-03-05 LAB — I-STAT CHEM 8, ED
BUN: 10 mg/dL (ref 8–23)
Calcium, Ion: 0.99 mmol/L — ABNORMAL LOW (ref 1.15–1.40)
Chloride: 95 mmol/L — ABNORMAL LOW (ref 98–111)
Creatinine, Ser: 1.1 mg/dL (ref 0.61–1.24)
Glucose, Bld: 250 mg/dL — ABNORMAL HIGH (ref 70–99)
HCT: 51 % (ref 39.0–52.0)
Hemoglobin: 17.3 g/dL — ABNORMAL HIGH (ref 13.0–17.0)
Potassium: 3.6 mmol/L (ref 3.5–5.1)
Sodium: 133 mmol/L — ABNORMAL LOW (ref 135–145)
TCO2: 25 mmol/L (ref 22–32)

## 2021-03-05 LAB — CBC WITH DIFFERENTIAL/PLATELET
Abs Immature Granulocytes: 0.01 10*3/uL (ref 0.00–0.07)
Basophils Absolute: 0 10*3/uL (ref 0.0–0.1)
Basophils Relative: 1 %
Eosinophils Absolute: 0.1 10*3/uL (ref 0.0–0.5)
Eosinophils Relative: 1 %
HCT: 49.5 % (ref 39.0–52.0)
Hemoglobin: 16.8 g/dL (ref 13.0–17.0)
Immature Granulocytes: 0 %
Lymphocytes Relative: 41 %
Lymphs Abs: 2.4 10*3/uL (ref 0.7–4.0)
MCH: 31.8 pg (ref 26.0–34.0)
MCHC: 33.9 g/dL (ref 30.0–36.0)
MCV: 93.8 fL (ref 80.0–100.0)
Monocytes Absolute: 0.5 10*3/uL (ref 0.1–1.0)
Monocytes Relative: 9 %
Neutro Abs: 2.8 10*3/uL (ref 1.7–7.7)
Neutrophils Relative %: 48 %
Platelets: 225 10*3/uL (ref 150–400)
RBC: 5.28 MIL/uL (ref 4.22–5.81)
RDW: 12.3 % (ref 11.5–15.5)
WBC: 5.7 10*3/uL (ref 4.0–10.5)
nRBC: 0 % (ref 0.0–0.2)

## 2021-03-05 LAB — URINALYSIS, ROUTINE W REFLEX MICROSCOPIC
Bilirubin Urine: NEGATIVE
Glucose, UA: 150 mg/dL — AB
Hgb urine dipstick: NEGATIVE
Ketones, ur: NEGATIVE mg/dL
Leukocytes,Ua: NEGATIVE
Nitrite: NEGATIVE
Protein, ur: NEGATIVE mg/dL
Specific Gravity, Urine: 1.005 (ref 1.005–1.030)
pH: 5 (ref 5.0–8.0)

## 2021-03-05 LAB — RAPID URINE DRUG SCREEN, HOSP PERFORMED
Amphetamines: NOT DETECTED
Barbiturates: NOT DETECTED
Benzodiazepines: NOT DETECTED
Cocaine: NOT DETECTED
Opiates: NOT DETECTED
Tetrahydrocannabinol: NOT DETECTED

## 2021-03-05 LAB — ETHANOL: Alcohol, Ethyl (B): 369 mg/dL (ref <10)

## 2021-03-05 MED ORDER — LACTATED RINGERS IV BOLUS: 1000.0000 mL | Freq: Once | INTRAVENOUS | Status: DC

## 2021-03-05 NOTE — ED Triage Notes (Addendum)
Pt BIB [facility] from home after wife found him face down, diaphoretic, and drooling. Patient reported to EMS that he tripped over the dog and was not able to get up. Wife called d/t him being disoriented and uncooperative. EMS reports pt disoriented to place and time. Wife also reports pt has been sick recently, N/V/D fever and chills. Patient currently uncooperative and attempting to leave.

## 2021-03-05 NOTE — ED Notes (Signed)
Patient returned from CT. Patient provided urinal for urine specimen.

## 2021-03-05 NOTE — ED Provider Notes (Signed)
[facility] EMERGENCY DEPARTMENT Provider Note   CSN: [id number] Arrival date & time: 03/05/21  1911     History  Chief Complaint  Patient presents with   Altered Mental Status    John Simpson is a 71 y.o. male.  HPI 71 year old male presents via EMS for altered mental status/difficulty walking.  Per the paramedic I talked to, the patient's wife found him on the floor facedown, diaphoretic and drooling.  Patient had told EMS that he tripped and could not get up.  Wife was concerned that he was altered.  Patient was initially disoriented with EMS though now seems to be oriented.  Patient denies any illicit drug or alcohol use.  He reports that last weekend he had a GI bug that he got from his family.  Otherwise he states his stomach was a little upset today but he is feeling fine.  He does report that he hit his head and EMS reported he had some abrasions to his forehead.  Otherwise, paramedic was telling me that when he was trying to walk he was off balance and looked like he was drunk.  He does have muscle relaxers and took them today though he reports there were no extras.  Home Medications Prior to Admission medications   Medication Sig Start Date End Date Taking? Authorizing Provider  ALPRAZolam Prudy Feeler) 0.5 MG tablet Take 0.5 mg by mouth 3 (three) times daily as needed for anxiety or sleep. Reported on 06/24/2015 05/06/15   [provider]  aspirin 81 MG EC tablet Take 81 mg by mouth daily. Reported on 06/24/2015    [provider]  cefTRIAXone 2 g in dextrose 5 % 50 mL Inject 2 g into the vein daily. 05/28/15   Marshia Ly, PA-C  Cholecalciferol (VITAMIN D) 2000 units tablet Take 2,000 Units by mouth daily. Reported on 06/24/2015    [provider]  diclofenac (VOLTAREN) 75 MG EC tablet Take 75 mg by mouth 2 (two) times daily. Reported on 06/24/2015 05/14/15   [provider]  fish oil-omega-3 fatty acids 1000 MG capsule Take 2 g by  mouth daily. Reported on 06/24/2015    [provider]  fluticasone (FLONASE) 50 MCG/ACT nasal spray Place 1 spray into both nostrils daily as needed for allergies. Reported on 06/24/2015 03/09/15   [provider]  lisinopril-hydrochlorothiazide (PRINZIDE,ZESTORETIC) 20-12.5 MG per tablet Take 1 tablet by mouth daily. Reported on 06/24/2015    [provider]  methocarbamol (ROBAXIN) 500 MG tablet Take 500 mg by mouth daily as needed for muscle spasms. Reported on 06/24/2015 11/20/14   [provider]  Multiple Vitamins-Minerals (MULTIVITAMIN WITH MINERALS) tablet Take 1 tablet by mouth daily. Reported on 06/24/2015    [provider]  oxyCODONE (ROXICODONE) 15 MG immediate release tablet Take 1 tablet (15 mg total) by mouth every 6 (six) hours as needed for pain. 05/25/15   Marshia Ly, PA-C  SUMAtriptan (IMITREX) 100 MG tablet 1 tablet. 01/06/15   [provider]  testosterone cypionate (DEPOTESTOSTERONE CYPIONATE) 200 MG/ML injection Inject 200 mg into the muscle every 14 (fourteen) days.  05/11/15   [provider]  vancomycin 1,500 mg in sodium chloride 0.9 % 500 mL Inject 1,500 mg into the vein every 12 (twelve) hours. Patient not taking: Reported on 06/24/2015 05/28/15   Marshia Ly, PA-C  VENTOLIN HFA 108 (90 Base) MCG/ACT inhaler Inhale 2 puffs into the lungs every 4 (four) hours as needed for wheezing or shortness of  breath.  03/09/15   [provider]  vitamin C (ASCORBIC ACID) 500 MG tablet Take 1 tablet (500 mg total) by mouth daily. 02/01/12   Kirtland Bouchard, PA-C  zolpidem (AMBIEN) 10 MG tablet Take 10 mg by mouth at bedtime as needed for sleep. Reported on 06/24/2015 04/23/15   [provider]      Allergies    Patient has no known allergies.    Review of Systems   Review of Systems  Constitutional:  Negative for fever.  Cardiovascular:  Negative for chest pain.  Musculoskeletal:  Negative for neck pain.   Neurological:  Negative for weakness, numbness and headaches.   Physical Exam Updated Vital Signs BP 132/72 (BP Location: Right Arm)    Pulse 85    Temp 98.7 F (37.1 C) (Oral)    Resp 20    SpO2 97%  Physical Exam Vitals and nursing note reviewed.  Constitutional:      Appearance: He is well-developed.  HENT:     Head: Normocephalic. Abrasion present.   Eyes:     Extraocular Movements:     Right eye: Nystagmus present.     Left eye: Nystagmus present.     Pupils: Pupils are equal, round, and reactive to light.  Cardiovascular:     Rate and Rhythm: Normal rate and regular rhythm.     Heart sounds: Normal heart sounds.  Pulmonary:     Effort: Pulmonary effort is normal.     Breath sounds: Normal breath sounds.  Abdominal:     General: There is no distension.     Palpations: Abdomen is soft.     Tenderness: There is no abdominal tenderness.  Skin:    General: Skin is warm and dry.  Neurological:     Mental Status: He is alert and oriented to person, place, and time.     Comments: Patient is alert and oriented to person, place, time. However he is acting intoxicated. CN 3-12 grossly intact. 5/5 strength in all 4 extremities. Grossly normal sensation. Mild shakiness with finger to nose but is able to complete the activity bilaterally    ED Results / Procedures / Treatments   Labs (all labs ordered are listed, but only abnormal results are displayed) Labs Reviewed  COMPREHENSIVE METABOLIC PANEL - Abnormal; Notable for the following components:      Result Value   Sodium 132 (*)    Chloride 95 (*)    Glucose, Bld 253 (*)    Calcium 8.5 (*)    AST 128 (*)    ALT 109 (*)    Total Bilirubin <0.1 (*)    All other components within normal limits  ETHANOL - Abnormal; Notable for the following components:   Alcohol, Ethyl (B) 369 (*)    All other components within normal limits  URINALYSIS, ROUTINE W REFLEX MICROSCOPIC - Abnormal; Notable for the following components:    Color, Urine STRAW (*)    Glucose, UA 150 (*)    All other components within normal limits  I-STAT CHEM 8, ED - Abnormal; Notable for the following components:   Sodium 133 (*)    Chloride 95 (*)    Glucose, Bld 250 (*)    Calcium, Ion 0.99 (*)    Hemoglobin 17.3 (*)    All other components within normal limits  CBC WITH DIFFERENTIAL/PLATELET  RAPID URINE DRUG SCREEN, HOSP PERFORMED    EKG EKG Interpretation  Date/Time:  Friday March 05 2021 19:36:36 EST Ventricular Rate:  82 PR Interval:  193 QRS Duration: 107 QT Interval:  393 QTC Calculation: 459 R Axis:   -73 Text Interpretation: Sinus rhythm Left anterior fascicular block Abnormal R-wave progression, late transition similar to May 2017 Confirmed by Pricilla LovelessGoldston, Arda Keadle [phone]([id number]) on 03/05/2021 8:08:21 PM  Radiology CT Head Wo Contrast  Result Date: 03/05/2021 CLINICAL DATA:  Mental status change and neck trauma following fall. EXAM: CT HEAD WITHOUT CONTRAST CT CERVICAL SPINE WITHOUT CONTRAST TECHNIQUE: Multidetector CT imaging of the head and cervical spine was performed following the standard protocol without intravenous contrast. Multiplanar CT image reconstructions of the cervical spine were also generated. RADIATION DOSE REDUCTION: This exam was performed according to the departmental dose-optimization program which includes automated exposure control, adjustment of the mA and/or kV according to patient size and/or use of iterative reconstruction technique. COMPARISON:  None. FINDINGS: CT HEAD FINDINGS Brain: No acute intracranial hemorrhage, midline shift or mass effect. No extra-axial fluid collection. Gray-white matter differentiation is within normal limits. There is no hydrocephalus. Vascular: No hyperdense vessel or unexpected calcification. Skull: Normal. Negative for fracture or focal lesion. Sinuses/Orbits: No acute finding. Other: None. CT CERVICAL SPINE FINDINGS Alignment: Normal. Skull base and vertebrae: No acute  fracture. No primary bone lesion or focal pathologic process. Soft tissues and spinal canal: No prevertebral fluid or swelling. No visible canal hematoma. Disc levels: Intervertebral disc space narrowing and uncovertebral osteophyte formation are noted resulting in mild to moderate spinal canal and moderate to severe neural foraminal stenosis. Upper chest: Negative. Other: None. IMPRESSION: 1. No acute intracranial process. 2. No acute fracture or subluxation in the cervical spine. Electronically Signed   By: Thornell SartoriusLaura  Taylor M.D.   On: 03/05/2021 20:20   CT Cervical Spine Wo Contrast  Result Date: 03/05/2021 CLINICAL DATA:  Mental status change and neck trauma following fall. EXAM: CT HEAD WITHOUT CONTRAST CT CERVICAL SPINE WITHOUT CONTRAST TECHNIQUE: Multidetector CT imaging of the head and cervical spine was performed following the standard protocol without intravenous contrast. Multiplanar CT image reconstructions of the cervical spine were also generated. RADIATION DOSE REDUCTION: This exam was performed according to the departmental dose-optimization program which includes automated exposure control, adjustment of the mA and/or kV according to patient size and/or use of iterative reconstruction technique. COMPARISON:  None. FINDINGS: CT HEAD FINDINGS Brain: No acute intracranial hemorrhage, midline shift or mass effect. No extra-axial fluid collection. Gray-white matter differentiation is within normal limits. There is no hydrocephalus. Vascular: No hyperdense vessel or unexpected calcification. Skull: Normal. Negative for fracture or focal lesion. Sinuses/Orbits: No acute finding. Other: None. CT CERVICAL SPINE FINDINGS Alignment: Normal. Skull base and vertebrae: No acute fracture. No primary bone lesion or focal pathologic process. Soft tissues and spinal canal: No prevertebral fluid or swelling. No visible canal hematoma. Disc levels: Intervertebral disc space narrowing and uncovertebral osteophyte  formation are noted resulting in mild to moderate spinal canal and moderate to severe neural foraminal stenosis. Upper chest: Negative. Other: None. IMPRESSION: 1. No acute intracranial process. 2. No acute fracture or subluxation in the cervical spine. Electronically Signed   By: Thornell SartoriusLaura  Taylor M.D.   On: 03/05/2021 20:20    Procedures Procedures    Medications Ordered in ED Medications  lactated ringers bolus 1,000 mL (1,000 mLs Intravenous Not Given 03/05/21 2137)    ED Course/ Medical Decision Making/ A&P                            Presentation  is consistent with alcohol intoxication.  Clinically he is acting intoxicated though he denies EtOH use.  Labs have been reviewed/interpreted by myself and he has some hyperglycemia but no acidosis.  Alcohol level is 369.  CT head images reviewed by myself and no head bleed.  CT C-spine is negative.  ECG was obtained and interpreted by myself and there is no acute ischemia.  Vital signs are normal.  I discussed results with him and wife.  They are adamant that he has not drank.  However somehow he has become intoxicated.  He is able to ambulate requiring no assistance and wife is comfortable taking him home so think is reasonable to discharge into her care.  Otherwise can follow-up with PCP.  Had minor trauma but no significant findings on work-up.        Final Clinical Impression(s) / ED Diagnoses Final diagnoses:  Alcoholic intoxication without complication [facility])    Rx / DC Orders ED Discharge Orders     None         Pricilla Loveless, MD 03/05/21 2233

## 2021-03-05 NOTE — ED Notes (Signed)
Patient transported to CT 

## 2022-08-07 ENCOUNTER — Encounter (HOSPITAL_BASED_OUTPATIENT_CLINIC_OR_DEPARTMENT_OTHER): Payer: Self-pay

## 2022-08-07 ENCOUNTER — Emergency Department (HOSPITAL_BASED_OUTPATIENT_CLINIC_OR_DEPARTMENT_OTHER)
Admission: EM | Admit: 2022-08-07 | Discharge: 2022-08-07 | Disposition: A | Discharge status: Home / Self Care | Payer: Medicare Other | Attending: Emergency Medicine | Admitting: Emergency Medicine

## 2022-08-07 DIAGNOSIS — W268XXA Contact with other sharp object(s), not elsewhere classified, initial encounter: Secondary | ICD-10-CM | POA: Diagnosis not present

## 2022-08-07 DIAGNOSIS — S61210A Laceration without foreign body of right index finger without damage to nail, initial encounter: Secondary | ICD-10-CM | POA: Insufficient documentation

## 2022-08-07 DIAGNOSIS — Z7982 Long term (current) use of aspirin: Secondary | ICD-10-CM | POA: Diagnosis not present

## 2022-08-07 NOTE — Discharge Instructions (Signed)
You have been evaluated for your injury.  You have a very shallow laceration that should heal appropriately without the need for suture repair.  Please keep the wound clean for the first 48 hours and let it air dry afterward to allow for it to form a scab.  Monitor for any signs of infection.

## 2022-08-07 NOTE — ED Notes (Signed)
ED PA at [facility] 

## 2022-08-07 NOTE — ED Provider Notes (Signed)
[place] EMERGENCY DEPARTMENT AT [facility] Provider Note   CSN: [id number] Arrival date & time: 08/07/22  2841     History  Chief Complaint  Patient presents with   Laceration    John Simpson is a 72 y.o. male.  The history is provided by the patient, medical records and the spouse. No language interpreter was used.  Laceration    72 year old male presenting for evaluation of finger injury.  Patient reports that yesterday around 3 PM he was using a table saw and accidentally injured his right index finger with a blade.  He did try to wash it and put a dressing over that and continue throughout the day.  States pain is minimal.  Today it did bleed a bit so he decided to come to the ER for further evaluation.  He is up-to-date with tetanus he denies any numbness denies any other injury he is right-hand dominant.  He is not on any blood thinner medication.  Home Medications Prior to Admission medications   Medication Sig Start Date End Date Taking? Authorizing Provider  ALPRAZolam Prudy Feeler) 0.5 MG tablet Take 0.5 mg by mouth 3 (three) times daily as needed for anxiety or sleep. Reported on 06/24/2015 05/06/15   [provider]  aspirin 81 MG EC tablet Take 81 mg by mouth daily. Reported on 06/24/2015    [provider]  cefTRIAXone 2 g in dextrose 5 % 50 mL Inject 2 g into the vein daily. 05/28/15   Marshia Ly, PA-C  Cholecalciferol (VITAMIN D) 2000 units tablet Take 2,000 Units by mouth daily. Reported on 06/24/2015    [provider]  diclofenac (VOLTAREN) 75 MG EC tablet Take 75 mg by mouth 2 (two) times daily. Reported on 06/24/2015 05/14/15   [provider]  fish oil-omega-3 fatty acids 1000 MG capsule Take 2 g by mouth daily. Reported on 06/24/2015    [provider]  fluticasone (FLONASE) 50 MCG/ACT nasal spray Place 1 spray into both nostrils daily as needed for allergies. Reported on 06/24/2015 03/09/15   [provider]  lisinopril-hydrochlorothiazide (PRINZIDE,ZESTORETIC) 20-12.5 MG per tablet Take 1 tablet by mouth daily. Reported on 06/24/2015    [provider]  methocarbamol (ROBAXIN) 500 MG tablet Take 500 mg by mouth daily as needed for muscle spasms. Reported on 06/24/2015 11/20/14   [provider]  Multiple Vitamins-Minerals (MULTIVITAMIN WITH MINERALS) tablet Take 1 tablet by mouth daily. Reported on 06/24/2015    [provider]  oxyCODONE (ROXICODONE) 15 MG immediate release tablet Take 1 tablet (15 mg total) by mouth every 6 (six) hours as needed for pain. 05/25/15   Marshia Ly, PA-C  SUMAtriptan (IMITREX) 100 MG tablet 1 tablet. 01/06/15   [provider]  testosterone cypionate (DEPOTESTOSTERONE CYPIONATE) 200 MG/ML injection Inject 200 mg into the muscle every 14 (fourteen) days.  05/11/15   [provider]  vancomycin 1,500 mg in sodium chloride 0.9 % 500 mL Inject 1,500 mg into the vein every 12 (twelve) hours. Patient not taking: Reported on 06/24/2015 05/28/15   Marshia Ly, PA-C  VENTOLIN HFA 108 (90 Base) MCG/ACT inhaler Inhale 2 puffs into the lungs every 4 (four) hours as needed for wheezing or shortness of breath.  03/09/15   [provider]  vitamin C (ASCORBIC ACID) 500 MG tablet Take 1 tablet (500 mg total) by mouth daily. 02/01/12   Kirtland Bouchard, PA-C  zolpidem (AMBIEN) 10 MG tablet Take 10 mg by mouth at  bedtime as needed for sleep. Reported on 06/24/2015 04/23/15   [provider]      Allergies    Patient has no known allergies.    Review of Systems   Review of Systems  All other systems reviewed and are negative.   Physical Exam Updated Vital Signs BP 131/75 (BP Location: Left Arm)   Pulse (!) 49   Temp 97.7 F (36.5 C) (Oral)   Resp 16   Ht 5\' 11"  (1.803 m)   Wt 97.5 kg   SpO2 96%   BMI 29.99 kg/m  Physical Exam Vitals and nursing note reviewed.  Constitutional:      General: He is not in acute  distress.    Appearance: He is well-developed.  HENT:     Head: Atraumatic.  Eyes:     Conjunctiva/sclera: Conjunctivae normal.  Musculoskeletal:        General: Signs of injury (Right index finger: There is a very shallow 1 cm laceration noted to the dorsum of the PIP not actively bleeding no foreign body noted patient able to flex and extend the joint without difficulty and sensation is intact distally.) present.     Cervical back: Neck supple.  Skin:    Findings: No rash.  Neurological:     Mental Status: He is alert.     ED Results / Procedures / Treatments   Labs (all labs ordered are listed, but only abnormal results are displayed) Labs Reviewed - No data to display  EKG None  Radiology No results found.  Procedures Procedures    Medications Ordered in ED Medications - No data to display  ED Course/ Medical Decision Making/ A&P                             Medical Decision Making  BP 131/75 (BP Location: Left Arm)   Pulse (!) 49   Temp 97.7 F (36.5 C) (Oral)   Resp 16   Ht 5\' 11"  (1.803 m)   Wt 97.5 kg   SpO2 96%   BMI 29.99 kg/m   57:44 AM  72 year old male presenting for evaluation of finger injury.  Patient reports that yesterday around 3 PM he was using a table saw and accidentally injured his right index finger with a blade.  He did try to wash it and put a dressing over that and continue throughout the day.  States pain is minimal.  Today it did bleed a bit so he decided to come to the ER for further evaluation.  He is up-to-date with tetanus he denies any numbness denies any other injury he is right-hand dominant.  He is not on any blood thinner medication.  On exam patient has a very shallow laceration noted to his second finger overlying the PIP joint dorsally without any joint involvement there is no foreign body noted patient is neurovascular intact and no signs of tendon injury or joint injury.  I have low suspicion for fracture.  At this time,  wound does not require laceration repair it is not actively bleeding it is shallow and I felt wound closure may increase risk of infection.  I discussed this with patient and recommend healing by secondary intention.  Patient agrees.  Return precaution given.  X-ray of finger considered but not performed last week after suspicion for bony injury.        Final Clinical Impression(s) / ED Diagnoses Final diagnoses:  Laceration of right  index finger without foreign body without damage to nail, initial encounter    Rx / DC Orders ED Discharge Orders     None         Fayrene Helper, PA-C 08/07/22 1000    Lorre Nick, MD 08/09/22 1345

## 2022-08-07 NOTE — ED Triage Notes (Signed)
Yesterday cut right 2nd finger on table saw  Lac across 2nd join on finger.  Bleeding controlled

## 2022-09-29 IMAGING — CT CT HEAD W/O CM
4 series · 16 of 47 positions shown, 18 images · non-contrast
Comparison: None.

CLINICAL DATA: Mental status change and neck trauma following fall.



[Series 3: head wo · axial · 0.46mm/px · z∈[-84,+36]mm · 7 of 33 slices shown, 9 images]
[im 5/33  brain]
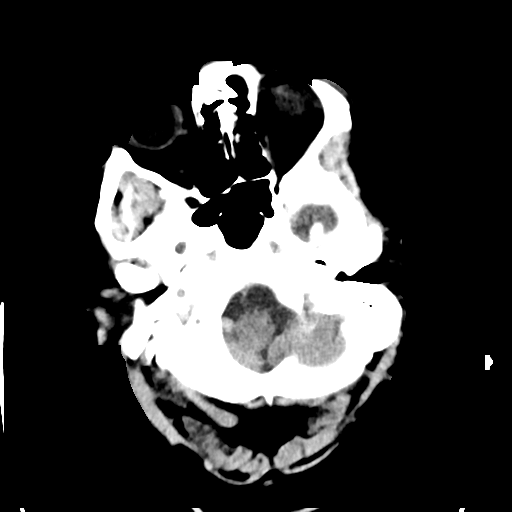
[im 5/33  bone]
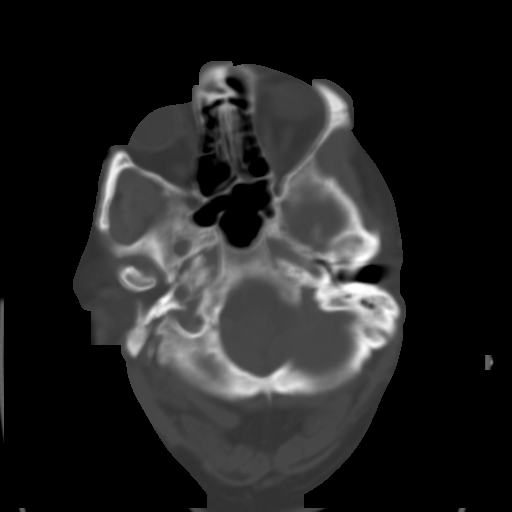
[im 9/33  brain]
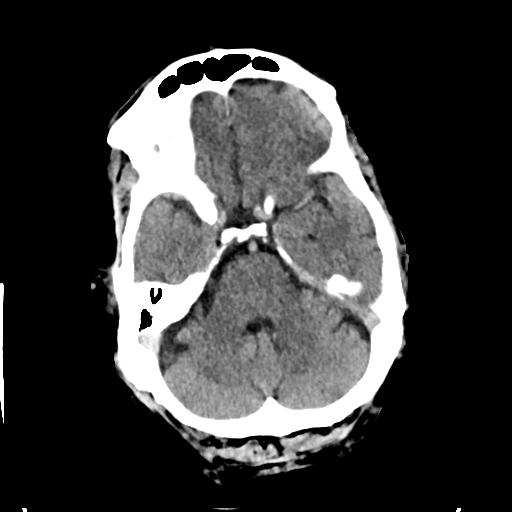
[im 13/33  brain]
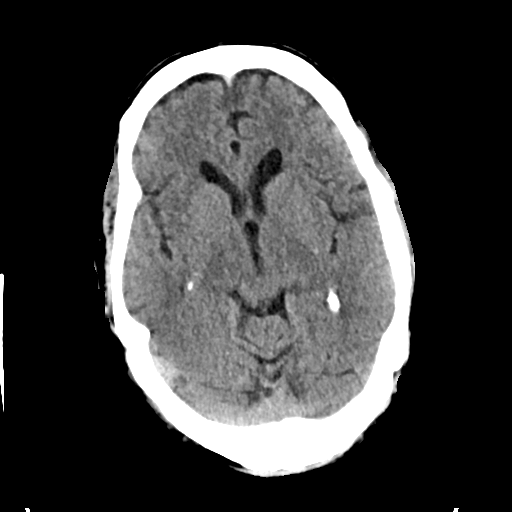
[im 17/33  brain]
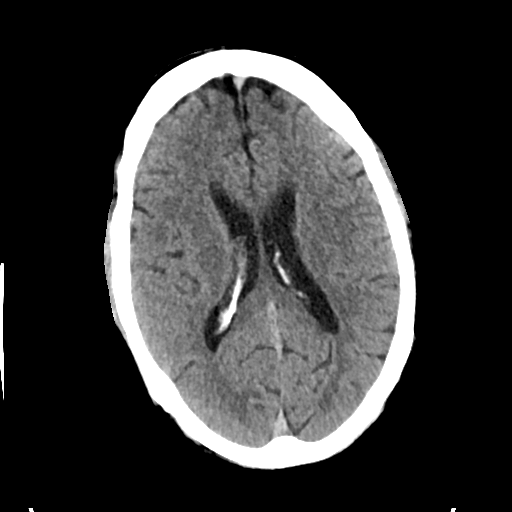
[im 21/33  brain]
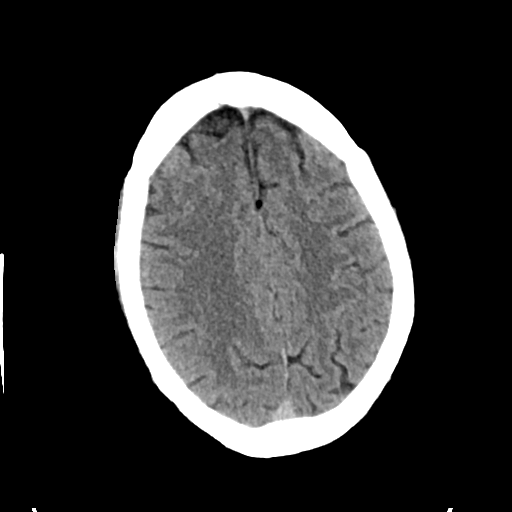
[im 21/33  bone]
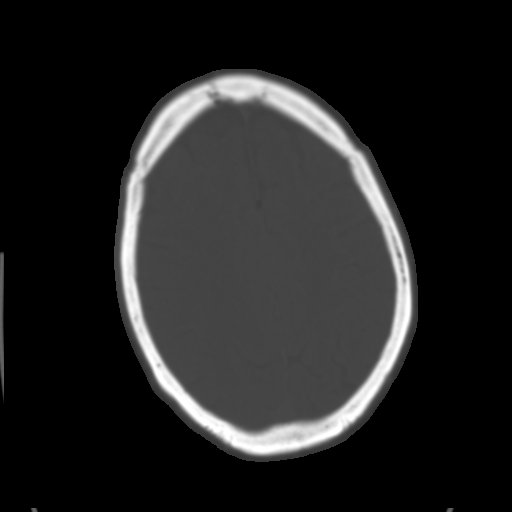
[im 25/33  brain]
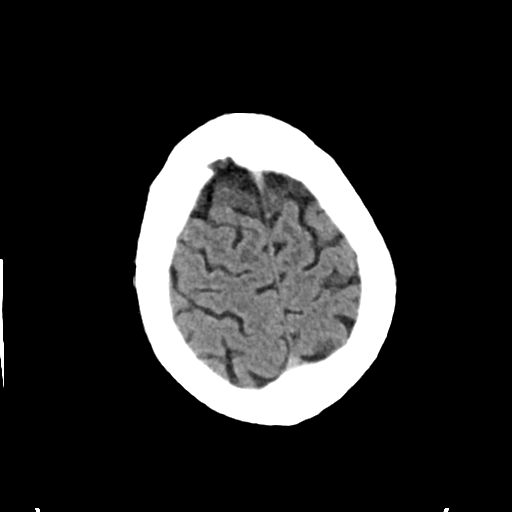
[im 29/33  brain]
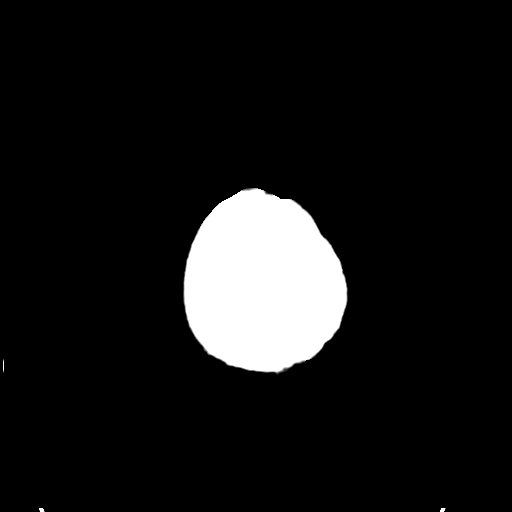

[Series 4: head bone · axial · 0.46mm/px · z∈[-88,-56]mm · 3 of 82 slices shown]
[im 9/82  bone]
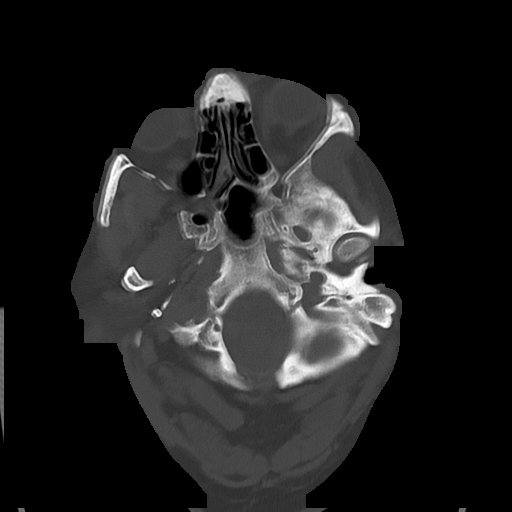
[im 17/82  bone]
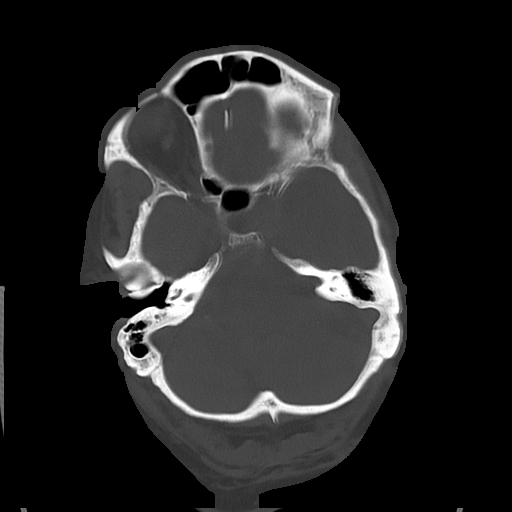
[im 25/82  bone]
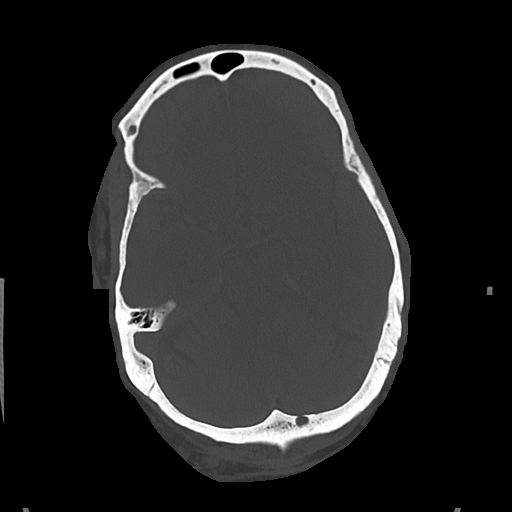

[Series 5: cor soft · coronal · 0.33mm/px · 3 of 73 slices shown]
[im 25/73  brain]
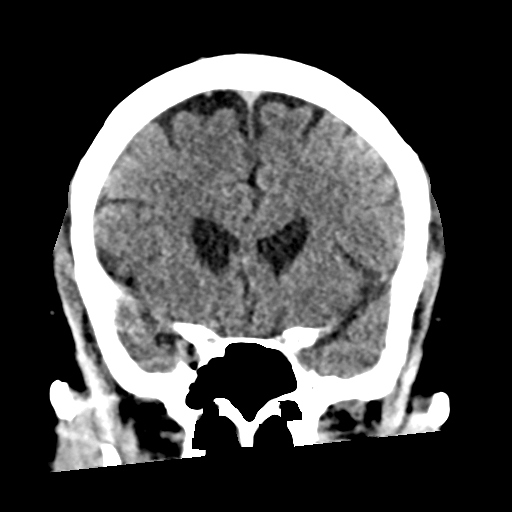
[im 33/73  brain]
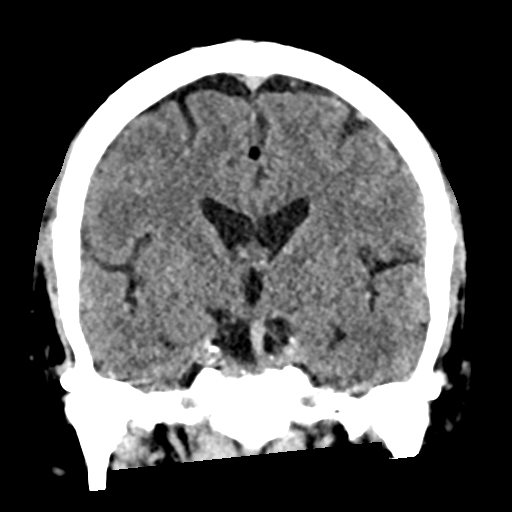
[im 41/73  brain]
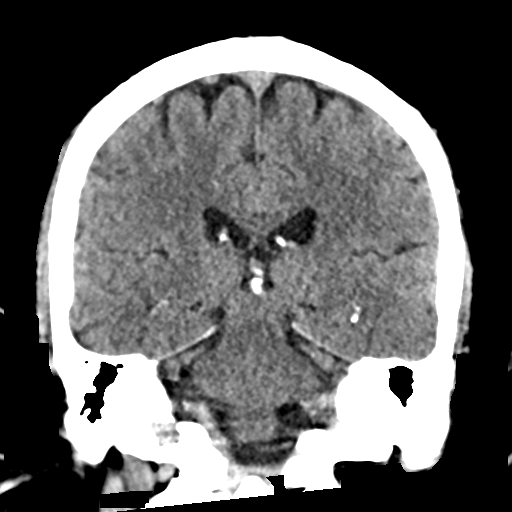

[Series 6: sag soft · sagittal · 0.33mm/px · 3 of 60 slices shown]
[im 20/60  brain]
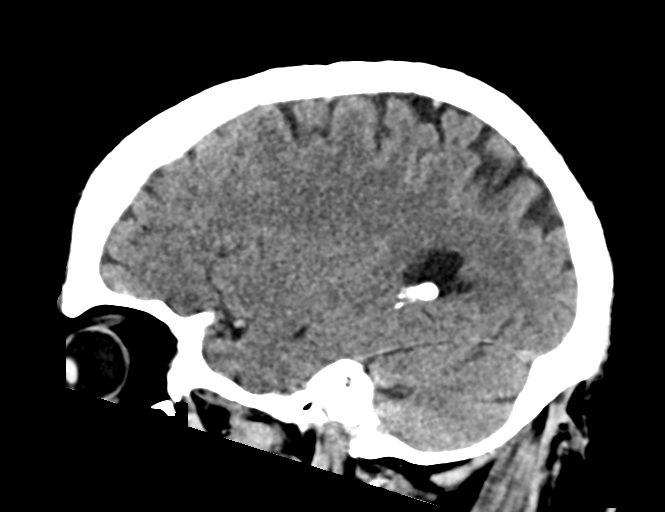
[im 30/60  brain]
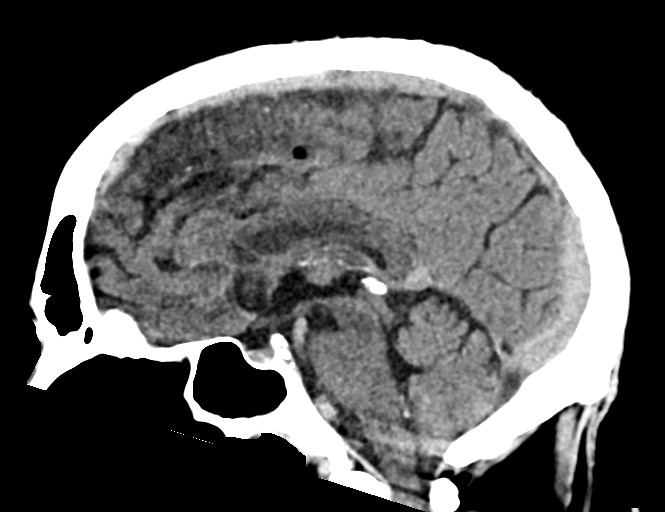
[im 40/60  brain]
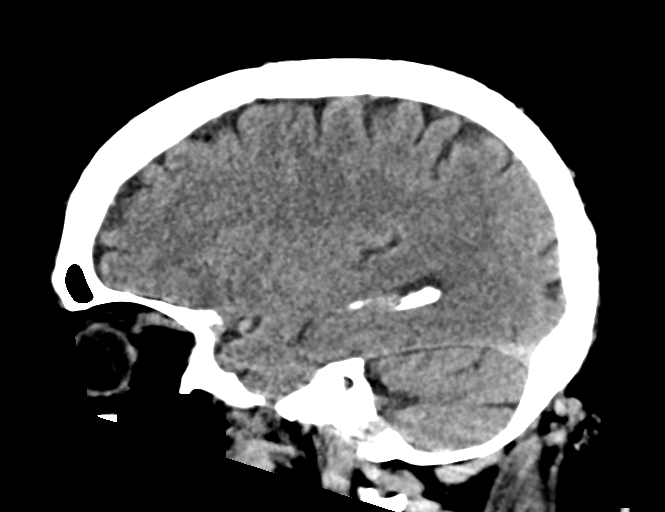

[16 of 47 positions shown; findings below may reference images not displayed]

FINDINGS: CT HEAD FINDINGS

Brain: No acute intracranial hemorrhage, midline shift or mass
effect. No extra-axial fluid collection. Gray-white matter
differentiation is within normal limits. There is no hydrocephalus.

Vascular: No hyperdense vessel or unexpected calcification.

Skull: Normal. Negative for fracture or focal lesion.

Sinuses/Orbits: No acute finding.

Other: None.

CT CERVICAL SPINE FINDINGS

Alignment: Normal.

Skull base and vertebrae: No acute fracture. No primary bone lesion
or focal pathologic process.

Soft tissues and spinal canal: No prevertebral fluid or swelling. No
visible canal hematoma.

Disc levels: Intervertebral disc space narrowing and uncovertebral
osteophyte formation are noted resulting in mild to moderate spinal
canal and moderate to severe neural foraminal stenosis.

Upper chest: Negative.

Other: None.
IMPRESSION: 1. No acute intracranial process.
2. No acute fracture or subluxation in the cervical spine.
